# Patient Record
Sex: Female | Born: 1975 | Race: Black or African American | Hispanic: No | Marital: Single | Smoking: Current every day smoker
Health system: Southern US, Community
[De-identification: ages and names within clinical notes are randomized; demographics above are authoritative.]

## PROBLEM LIST (undated history)

## (undated) DIAGNOSIS — I10 Essential (primary) hypertension: Secondary | ICD-10-CM

## (undated) DIAGNOSIS — F4310 Post-traumatic stress disorder, unspecified: Secondary | ICD-10-CM

---

## 1997-09-04 ENCOUNTER — Inpatient Hospital Stay (HOSPITAL_COMMUNITY): Admission: AD | Admit: 1997-09-04 | Discharge: 1997-09-05 | Payer: Self-pay | Admitting: *Deleted

## 1997-11-11 ENCOUNTER — Other Ambulatory Visit: Admission: RE | Admit: 1997-11-11 | Discharge: 1997-11-11 | Payer: Self-pay | Admitting: *Deleted

## 1998-01-27 ENCOUNTER — Ambulatory Visit (HOSPITAL_COMMUNITY): Admission: RE | Admit: 1998-01-27 | Discharge: 1998-01-27 | Payer: Self-pay | Admitting: *Deleted

## 1998-02-28 ENCOUNTER — Emergency Department (HOSPITAL_COMMUNITY): Admission: EM | Admit: 1998-02-28 | Discharge: 1998-02-28 | Payer: Self-pay | Admitting: Internal Medicine

## 1998-06-15 ENCOUNTER — Inpatient Hospital Stay (HOSPITAL_COMMUNITY): Admission: AD | Admit: 1998-06-15 | Discharge: 1998-06-15 | Payer: Self-pay | Admitting: *Deleted

## 1998-12-08 ENCOUNTER — Emergency Department (HOSPITAL_COMMUNITY): Admission: EM | Admit: 1998-12-08 | Discharge: 1998-12-08 | Payer: Self-pay | Admitting: Emergency Medicine

## 1998-12-08 ENCOUNTER — Encounter: Payer: Self-pay | Admitting: Emergency Medicine

## 1999-12-05 ENCOUNTER — Emergency Department (HOSPITAL_COMMUNITY): Admission: EM | Admit: 1999-12-05 | Discharge: 1999-12-05 | Payer: Self-pay | Admitting: Emergency Medicine

## 1999-12-12 ENCOUNTER — Emergency Department (HOSPITAL_COMMUNITY): Admission: EM | Admit: 1999-12-12 | Discharge: 1999-12-12 | Payer: Self-pay | Admitting: Emergency Medicine

## 2000-02-27 ENCOUNTER — Inpatient Hospital Stay (HOSPITAL_COMMUNITY): Admission: EM | Admit: 2000-02-27 | Discharge: 2000-02-27 | Payer: Self-pay | Admitting: Obstetrics & Gynecology

## 2000-06-17 ENCOUNTER — Inpatient Hospital Stay (HOSPITAL_COMMUNITY): Admission: AD | Admit: 2000-06-17 | Discharge: 2000-06-17 | Payer: Self-pay | Admitting: *Deleted

## 2000-10-26 ENCOUNTER — Other Ambulatory Visit: Admission: RE | Admit: 2000-10-26 | Discharge: 2000-10-26 | Payer: Self-pay | Admitting: *Deleted

## 2000-12-11 ENCOUNTER — Emergency Department (HOSPITAL_COMMUNITY): Admission: EM | Admit: 2000-12-11 | Discharge: 2000-12-11 | Payer: Self-pay | Admitting: Emergency Medicine

## 2001-02-05 ENCOUNTER — Encounter: Payer: Self-pay | Admitting: *Deleted

## 2001-02-05 ENCOUNTER — Inpatient Hospital Stay (HOSPITAL_COMMUNITY): Admission: AD | Admit: 2001-02-05 | Discharge: 2001-02-05 | Payer: Self-pay | Admitting: *Deleted

## 2001-02-18 ENCOUNTER — Emergency Department (HOSPITAL_COMMUNITY): Admission: EM | Admit: 2001-02-18 | Discharge: 2001-02-18 | Payer: Self-pay | Admitting: Emergency Medicine

## 2001-03-06 ENCOUNTER — Encounter: Admission: RE | Admit: 2001-03-06 | Discharge: 2001-06-04 | Payer: Self-pay | Admitting: Obstetrics and Gynecology

## 2001-05-29 ENCOUNTER — Inpatient Hospital Stay (HOSPITAL_COMMUNITY): Admission: AD | Admit: 2001-05-29 | Discharge: 2001-05-29 | Payer: Self-pay | Admitting: Obstetrics and Gynecology

## 2001-09-05 ENCOUNTER — Inpatient Hospital Stay (HOSPITAL_COMMUNITY): Admission: AD | Admit: 2001-09-05 | Discharge: 2001-09-05 | Payer: Self-pay | Admitting: Obstetrics and Gynecology

## 2001-09-12 ENCOUNTER — Encounter: Payer: Self-pay | Admitting: General Surgery

## 2001-09-12 ENCOUNTER — Encounter: Payer: Self-pay | Admitting: Emergency Medicine

## 2001-09-12 ENCOUNTER — Inpatient Hospital Stay (HOSPITAL_COMMUNITY): Admission: AD | Admit: 2001-09-12 | Discharge: 2001-09-12 | Payer: Self-pay | Admitting: Obstetrics and Gynecology

## 2001-09-17 ENCOUNTER — Encounter (HOSPITAL_COMMUNITY): Admission: RE | Admit: 2001-09-17 | Discharge: 2001-09-17 | Payer: Self-pay | Admitting: Obstetrics and Gynecology

## 2001-09-22 ENCOUNTER — Inpatient Hospital Stay (HOSPITAL_COMMUNITY): Admission: AD | Admit: 2001-09-22 | Discharge: 2001-09-24 | Payer: Self-pay | Admitting: Obstetrics and Gynecology

## 2001-11-22 ENCOUNTER — Other Ambulatory Visit: Admission: RE | Admit: 2001-11-22 | Discharge: 2001-11-22 | Payer: Self-pay | Admitting: Obstetrics and Gynecology

## 2003-03-17 ENCOUNTER — Encounter: Admission: RE | Admit: 2003-03-17 | Discharge: 2003-06-15 | Payer: Self-pay | Admitting: Obstetrics & Gynecology

## 2003-10-26 ENCOUNTER — Ambulatory Visit (HOSPITAL_COMMUNITY): Admission: RE | Admit: 2003-10-26 | Discharge: 2003-10-26 | Payer: Self-pay | Admitting: Obstetrics & Gynecology

## 2003-11-04 ENCOUNTER — Emergency Department (HOSPITAL_COMMUNITY): Admission: EM | Admit: 2003-11-04 | Discharge: 2003-11-04 | Payer: Self-pay | Admitting: Emergency Medicine

## 2004-06-26 ENCOUNTER — Emergency Department (HOSPITAL_COMMUNITY): Admission: EM | Admit: 2004-06-26 | Discharge: 2004-06-26 | Payer: Self-pay | Admitting: Emergency Medicine

## 2004-12-01 ENCOUNTER — Emergency Department (HOSPITAL_COMMUNITY): Admission: EM | Admit: 2004-12-01 | Discharge: 2004-12-01 | Payer: Self-pay | Admitting: Family Medicine

## 2005-04-17 ENCOUNTER — Inpatient Hospital Stay (HOSPITAL_COMMUNITY): Admission: AD | Admit: 2005-04-17 | Discharge: 2005-04-17 | Payer: Self-pay | Admitting: Obstetrics

## 2005-07-24 DIAGNOSIS — I10 Essential (primary) hypertension: Secondary | ICD-10-CM

## 2005-11-14 ENCOUNTER — Encounter: Admission: RE | Admit: 2005-11-14 | Discharge: 2005-11-14 | Payer: Self-pay | Admitting: Gastroenterology

## 2006-10-24 ENCOUNTER — Inpatient Hospital Stay (HOSPITAL_COMMUNITY): Admission: AD | Admit: 2006-10-24 | Discharge: 2006-10-24 | Payer: Self-pay | Admitting: Obstetrics and Gynecology

## 2006-12-21 ENCOUNTER — Emergency Department (HOSPITAL_COMMUNITY): Admission: EM | Admit: 2006-12-21 | Discharge: 2006-12-21 | Payer: Self-pay | Admitting: Family Medicine

## 2007-02-23 ENCOUNTER — Emergency Department (HOSPITAL_COMMUNITY): Admission: EM | Admit: 2007-02-23 | Discharge: 2007-02-23 | Payer: Self-pay | Admitting: Emergency Medicine

## 2007-05-30 ENCOUNTER — Emergency Department (HOSPITAL_COMMUNITY): Admission: EM | Admit: 2007-05-30 | Discharge: 2007-05-30 | Payer: Self-pay | Admitting: Emergency Medicine

## 2007-06-10 ENCOUNTER — Emergency Department (HOSPITAL_COMMUNITY): Admission: EM | Admit: 2007-06-10 | Discharge: 2007-06-10 | Payer: Self-pay | Admitting: *Deleted

## 2007-08-11 ENCOUNTER — Emergency Department (HOSPITAL_COMMUNITY): Admission: EM | Admit: 2007-08-11 | Discharge: 2007-08-11 | Payer: Self-pay | Admitting: Family Medicine

## 2007-11-21 ENCOUNTER — Emergency Department (HOSPITAL_COMMUNITY): Admission: EM | Admit: 2007-11-21 | Discharge: 2007-11-21 | Payer: Self-pay | Admitting: Family Medicine

## 2008-08-08 ENCOUNTER — Emergency Department (HOSPITAL_COMMUNITY): Admission: EM | Admit: 2008-08-08 | Discharge: 2008-08-08 | Payer: Self-pay | Admitting: Emergency Medicine

## 2008-11-19 ENCOUNTER — Emergency Department (HOSPITAL_COMMUNITY): Admission: EM | Admit: 2008-11-19 | Discharge: 2008-11-20 | Payer: Self-pay | Admitting: Emergency Medicine

## 2008-11-19 LAB — CONVERTED CEMR LAB
BUN: 6 mg/dL
Chloride: 106 meq/L
Creatinine, Ser: 0.9 mg/dL
Hemoglobin: 12.7 g/dL
Platelets: 225 10*3/uL
Potassium: 3.3 meq/L
RDW: 13.6 %
WBC: 9.6 10*3/uL

## 2008-11-23 ENCOUNTER — Encounter (INDEPENDENT_AMBULATORY_CARE_PROVIDER_SITE_OTHER): Payer: Self-pay | Admitting: Nurse Practitioner

## 2008-12-03 ENCOUNTER — Ambulatory Visit: Payer: Self-pay | Admitting: Nurse Practitioner

## 2008-12-03 DIAGNOSIS — R634 Abnormal weight loss: Secondary | ICD-10-CM | POA: Insufficient documentation

## 2008-12-03 LAB — CONVERTED CEMR LAB

## 2008-12-04 ENCOUNTER — Encounter (INDEPENDENT_AMBULATORY_CARE_PROVIDER_SITE_OTHER): Payer: Self-pay | Admitting: Nurse Practitioner

## 2008-12-07 DIAGNOSIS — F4310 Post-traumatic stress disorder, unspecified: Secondary | ICD-10-CM

## 2009-04-14 ENCOUNTER — Emergency Department (HOSPITAL_COMMUNITY): Admission: EM | Admit: 2009-04-14 | Discharge: 2009-04-14 | Payer: Self-pay | Admitting: Family Medicine

## 2009-06-01 ENCOUNTER — Ambulatory Visit (HOSPITAL_COMMUNITY): Admission: RE | Admit: 2009-06-01 | Discharge: 2009-06-01 | Payer: Self-pay | Admitting: Obstetrics

## 2009-07-27 ENCOUNTER — Encounter: Admission: RE | Admit: 2009-07-27 | Discharge: 2009-07-27 | Payer: Self-pay | Admitting: Cardiology

## 2009-07-28 ENCOUNTER — Ambulatory Visit (HOSPITAL_COMMUNITY): Admission: RE | Admit: 2009-07-28 | Discharge: 2009-07-28 | Payer: Self-pay | Admitting: Cardiology

## 2010-07-19 ENCOUNTER — Emergency Department (HOSPITAL_COMMUNITY)
Admission: EM | Admit: 2010-07-19 | Discharge: 2010-07-19 | Payer: Self-pay | Source: Home / Self Care | Admitting: Emergency Medicine

## 2010-08-14 ENCOUNTER — Encounter: Payer: Self-pay | Admitting: Cardiology

## 2010-09-26 ENCOUNTER — Other Ambulatory Visit (HOSPITAL_COMMUNITY): Payer: Self-pay | Admitting: Obstetrics

## 2010-09-26 DIAGNOSIS — Z1231 Encounter for screening mammogram for malignant neoplasm of breast: Secondary | ICD-10-CM

## 2010-09-29 ENCOUNTER — Ambulatory Visit (HOSPITAL_COMMUNITY)
Admission: RE | Admit: 2010-09-29 | Discharge: 2010-09-29 | Disposition: A | Discharge status: Home / Self Care | Payer: Medicaid Other | Source: Ambulatory Visit | Attending: Obstetrics | Admitting: Obstetrics

## 2010-09-29 DIAGNOSIS — Z1231 Encounter for screening mammogram for malignant neoplasm of breast: Secondary | ICD-10-CM | POA: Insufficient documentation

## 2010-10-03 LAB — POCT PREGNANCY, URINE: Preg Test, Ur: NEGATIVE

## 2010-10-03 LAB — POCT I-STAT, CHEM 8
BUN: 4 mg/dL — ABNORMAL LOW (ref 6–23)
Calcium, Ion: 1.09 mmol/L — ABNORMAL LOW (ref 1.12–1.32)
Chloride: 104 mEq/L (ref 96–112)
Glucose, Bld: 86 mg/dL (ref 70–99)

## 2010-10-03 LAB — URINALYSIS, ROUTINE W REFLEX MICROSCOPIC
Bilirubin Urine: NEGATIVE
Ketones, ur: NEGATIVE mg/dL
Leukocytes, UA: NEGATIVE
Nitrite: NEGATIVE
Protein, ur: NEGATIVE mg/dL
Urobilinogen, UA: 0.2 mg/dL (ref 0.0–1.0)
pH: 7.5 (ref 5.0–8.0)

## 2010-10-03 LAB — URINE MICROSCOPIC-ADD ON

## 2010-10-04 ENCOUNTER — Other Ambulatory Visit: Payer: Self-pay | Admitting: Cardiology

## 2010-10-05 ENCOUNTER — Ambulatory Visit
Admission: RE | Admit: 2010-10-05 | Discharge: 2010-10-05 | Disposition: A | Discharge status: Home / Self Care | Payer: Medicaid Other | Source: Ambulatory Visit | Attending: Cardiology | Admitting: Cardiology

## 2010-10-05 ENCOUNTER — Other Ambulatory Visit: Payer: Medicaid Other

## 2010-10-27 ENCOUNTER — Ambulatory Visit (HOSPITAL_BASED_OUTPATIENT_CLINIC_OR_DEPARTMENT_OTHER): Payer: Medicaid Other | Attending: Cardiology

## 2010-10-27 DIAGNOSIS — G4730 Sleep apnea, unspecified: Secondary | ICD-10-CM | POA: Insufficient documentation

## 2010-10-27 DIAGNOSIS — G4710 Hypersomnia, unspecified: Secondary | ICD-10-CM | POA: Insufficient documentation

## 2010-10-27 DIAGNOSIS — Z79899 Other long term (current) drug therapy: Secondary | ICD-10-CM | POA: Insufficient documentation

## 2010-10-28 LAB — POCT URINALYSIS DIP (DEVICE)
Bilirubin Urine: NEGATIVE
Ketones, ur: NEGATIVE mg/dL
Protein, ur: NEGATIVE mg/dL
Specific Gravity, Urine: >=1.03 (ref 1.005–1.030)
pH: 5.5 (ref 5.0–8.0)

## 2010-11-02 LAB — POCT CARDIAC MARKERS
CKMB, poc: <1 ng/mL — ABNORMAL LOW (ref 1.0–8.0)
CKMB, poc: <1 ng/mL — ABNORMAL LOW (ref 1.0–8.0)
Myoglobin, poc: 37 ng/mL (ref 12–200)
Myoglobin, poc: 47.3 ng/mL (ref 12–200)
Troponin i, poc: <0.05 ng/mL (ref 0.00–0.09)

## 2010-11-02 LAB — POCT I-STAT, CHEM 8
BUN: 6 mg/dL (ref 6–23)
Chloride: 106 mEq/L (ref 96–112)
Creatinine, Ser: 0.9 mg/dL (ref 0.4–1.2)
Glucose, Bld: 85 mg/dL (ref 70–99)
Potassium: 3.3 mEq/L — ABNORMAL LOW (ref 3.5–5.1)
Sodium: 139 mEq/L (ref 135–145)

## 2010-11-02 LAB — CBC
MCV: 92.7 fL (ref 78.0–100.0)
RBC: 4.02 MIL/uL (ref 3.87–5.11)
WBC: 9.6 10*3/uL (ref 4.0–10.5)

## 2010-11-02 LAB — DIFFERENTIAL
Eosinophils Absolute: 0.1 10*3/uL (ref 0.0–0.7)
Lymphocytes Relative: 18 % (ref 12–46)
Lymphs Abs: 1.7 10*3/uL (ref 0.7–4.0)
Monocytes Relative: 5 % (ref 3–12)
Neutro Abs: 7.3 10*3/uL (ref 1.7–7.7)
Neutrophils Relative %: 76 % (ref 43–77)

## 2010-11-02 LAB — URINALYSIS, ROUTINE W REFLEX MICROSCOPIC
Bilirubin Urine: NEGATIVE
Ketones, ur: NEGATIVE mg/dL
Protein, ur: NEGATIVE mg/dL
Urobilinogen, UA: 1 mg/dL (ref 0.0–1.0)

## 2010-11-02 LAB — RAPID URINE DRUG SCREEN, HOSP PERFORMED
Benzodiazepines: NOT DETECTED
Tetrahydrocannabinol: POSITIVE — AB

## 2010-11-02 LAB — URINE MICROSCOPIC-ADD ON

## 2010-11-05 DIAGNOSIS — G4710 Hypersomnia, unspecified: Secondary | ICD-10-CM

## 2010-11-05 DIAGNOSIS — G4730 Sleep apnea, unspecified: Secondary | ICD-10-CM

## 2010-11-05 DIAGNOSIS — Z79899 Other long term (current) drug therapy: Secondary | ICD-10-CM

## 2010-11-06 NOTE — Procedures (Signed)
NAMEJACY, Penny Shelton              ACCOUNT NO.:  [id number]  MEDICAL RECORD NO.:  [id number]          PATIENT TYPE:  OUT  LOCATION:  SLEEP CENTER                 FACILITY:  [facility]  PHYSICIAN:  Clinton D. Maple Hudson, MD, FCCP, FACPDATE OF BIRTH:  05/04/75  DATE OF STUDY:  10/27/2010                           NOCTURNAL POLYSOMNOGRAM  REFERRING PHYSICIAN:  JEROME O SPRUILL  INDICATION FOR STUDY:  Hypersomnia with sleep apnea.  EPWORTH SLEEPINESS SCORE:  17/24, BMI 20.  Weight 120 pounds, height 65 inches.  Neck 15.5 inches.  MEDICATIONS:  Home medications are charted and reviewed.  SLEEP ARCHITECTURE:  Total sleep time 320 minutes with sleep efficiency 81%.  Stage I was 8.1%, stage II 71.6%, stage III absent, REM 20.3% of total sleep time.  Sleep latency 27 minutes, REM latency 83.5 minutes, awake after sleep onset 47 minutes, arousal index 24.9.  BEDTIME MEDICATION:  Tribenzor.  RESPIRATORY DATA:  Apnea-hypopnea index (AHI) 0.2 per hour.  A single event was recorded as a central apnea while non supine in REM.  REM/AHI 0.9, RDI 3.6 per hour.  There were insufficient numbers of events to initiate CPAP titration by split protocol on this study night.  OXYGEN DATA:  Moderate snoring with oxygen desaturation to a nadir of 96% and a mean oxygen saturation through the study of 97.9% on room air.  CARDIAC DATA:  Normal sinus rhythm.  MOVEMENT-PARASOMNIA:  No significant movement disturbance.  Bathroom x2.  IMPRESSIONS-RECOMMENDATIONS: 1. Unremarkable sleep architecture for sleep center environment     without sedating medication at bedtime on the study night. 2. Rare respiratory event with sleep disturbance, within normal     limits, AHI 0.2 per hour (normal range 0-5 per     hour).  Moderate snoring with oxygen desaturation to a nadir of 96%     and a mean oxygen saturation through the study of 97.9% on room     air.     Clinton D. Maple Hudson, MD, [facility] FACP Diplomate, Biomedical engineer  of Sleep Medicine Electronically Signed    CDY/MEDQ  D:  11/05/2010 15:04:57  T:  11/06/2010 06:46:44  Job:  [id number]

## 2010-11-07 LAB — POCT URINALYSIS DIP (DEVICE)
Nitrite: NEGATIVE
Protein, ur: 30 mg/dL — AB
pH: 6.5 (ref 5.0–8.0)

## 2010-11-07 LAB — WET PREP, GENITAL

## 2010-12-09 NOTE — Discharge Summary (Signed)
[facility]  Patient:    Penny Shelton, Penny Shelton Visit Number: [id number] MRN: [id number]          Service Type: OBS Location: 910A [id number] [id number] Attending Physician:  Wandalee Ferdinand Dictated by:   Rudy Jew Ashley Royalty, M.D. Admit Date:  09/22/2001 Discharge Date: 09/24/2001                             Discharge Summary  DISCHARGE DIAGNOSES:          1. Intrauterine pregnancy at term, delivered.                               2. Previous cesarean section.                               3. Smoker.                               4. History of drug use during pregnancy                                  including marijuana.                               5. Term born living child.  OPERATIONS/SPECIAL PROCEDURES:                   OB delivery.  CONSULTATIONS:                None.  DISCHARGE MEDICATIONS:        1. Anaprox DS 1 t.i.d. p.r.n.                               2. Phenergan 25 mg p.o. q.3-4h. p.r.n. nausea.  HISTORY AND PHYSICAL:         This is a 35 year old gravida 7, para 1-1-4-2, at 38-1/[redacted] weeks gestation with the aforementioned risk factors.  The patient presented in labor.  HOSPITAL COURSE:              The patient was admitted to [facility] [place].  Admission laboratory studies were drawn.  Initial cervical examination revealed the cervix to be 4 cm dilated, 75% effaced, -1 station, and vertex presentation.  The patient went on to labor and deliver on September 22, 2001.  The infant was a 6 pounds 14 ounce female, Apgars 8 at 1 minute and 9 at five minutes, sent to the newborn nursery.  The delivery was accomplished by Dr. Lonell Face over an intact perineum.  There was no laceration.  The delivery was complicated only by the operator catching the skin of the newborns left elbow in a Kelly clamp.  There was no sequela. Arterial cord pH was 7.39.  The patients postpartum course was benign.  She was discharged on the second postpartum  day, afebrile, and in satisfactory condition.  ACCESSORY CLINICAL FINDINGS:  Hemoglobin and hematocrit on admission were 11.1 and 32.8, respectively.  Repeat values obtained September 23, 2001, were 9.6 and 27.8, respectively.  DISPOSITION:  The patient is to return to [facility] in 4-6 weeks for postpartum examination. Dictated by:   Rudy Jew Ashley Royalty, M.D. Attending Physician:  Wandalee Ferdinand DD:  10/05/01 TD:  10/07/01 Job: [id number]

## 2010-12-09 NOTE — H&P (Signed)
   Penny Shelton, Penny Shelton                          ACCOUNT NO.:  [id number]   MEDICAL RECORD NO.:  [id number]                   PATIENT TYPE:  AMB   LOCATION:  SDC                                  FACILITY:  [facility]   PHYSICIAN:  James A. Ashley Royalty, M.D.             DATE OF BIRTH:  07-03-1976   DATE OF ADMISSION:  07/30/2002  DATE OF DISCHARGE:                                HISTORY & PHYSICAL   HISTORY OF PRESENT ILLNESS:  This is a 35 year old, gravida 7, para 2-1-4-2,  who recently presented complaining of abnormal uterine bleeding.  She has  been given every opportunity to be controlled medically including several  different oral contraceptives and each has been unsuccessful at controlling  her abnormal bleeding.  Ultrasound and sonohysterogram were performed  May 22, 2002, and were negative.  The patient is for  diagnostic/operative hysteroscopy and dilatation and curettage.   MEDICATIONS:  None.   PAST MEDICAL HISTORY:  Medical; negative.  Surgical; cesarean section.   ALLERGIES:  No known drug allergies.   SOCIAL HISTORY:  The patient has used nonprescription recreational drugs in  the past. She uses alcohol moderately. She has used tobacco in the past as  well.   REVIEW OF SYSTEMS:  Noncontributory.   PHYSICAL EXAMINATION:  GENERAL:  A well-developed, well-nourished, pleasant  black female in no acute distress.  VITAL SIGNS: Afebrile, vital signs stable.  SKIN:  Warm and dry without lesions.  LYMPHS: There is no supraclavicular, cervical, or inguinal adenopathy.  HEENT: Normocephalic.  NECK: Supple without thyromegaly.  CHEST:  Lungs clear.  HEART:  Regular rate and rhythm without murmurs, rubs, or gallops.  BREASTS: Deferred.  ABDOMEN: Soft and nontender without masses or organomegaly. Bowel sounds  active.  MUSCULOSKELETAL:  Full range of motion without edema, cyanosis, or CVA  tenderness.  PELVIC:  Deferred until examination under anesthesia.   IMPRESSION:   Abnormal uterine bleeding.   PLAN:  Diagnostic/operative hysteroscopy, dilatation and curettage.  The  risks, benefits, complications, and alternatives were fully discussed with  the patient. She states she understands and accepts. Questions were  evaluated and answered.                                              James A. Ashley Royalty, M.D.   JAM/MEDQ  D:  07/30/2002  T:  07/30/2002  Job:  [id number]

## 2011-05-15 ENCOUNTER — Emergency Department (HOSPITAL_COMMUNITY): Payer: Medicaid Other

## 2011-05-15 ENCOUNTER — Inpatient Hospital Stay (HOSPITAL_COMMUNITY)
Admission: EM | Admit: 2011-05-15 | Discharge: 2011-05-17 | DRG: 758 | Disposition: A | Discharge status: Home / Self Care | Payer: Medicaid Other | Attending: Internal Medicine | Admitting: Internal Medicine

## 2011-05-15 DIAGNOSIS — R51 Headache: Secondary | ICD-10-CM | POA: Diagnosis present

## 2011-05-15 DIAGNOSIS — R112 Nausea with vomiting, unspecified: Secondary | ICD-10-CM | POA: Diagnosis present

## 2011-05-15 DIAGNOSIS — R000 Tachycardia, unspecified: Secondary | ICD-10-CM | POA: Diagnosis present

## 2011-05-15 DIAGNOSIS — H53149 Visual discomfort, unspecified: Secondary | ICD-10-CM | POA: Diagnosis present

## 2011-05-15 DIAGNOSIS — R509 Fever, unspecified: Secondary | ICD-10-CM | POA: Diagnosis present

## 2011-05-15 DIAGNOSIS — Z87891 Personal history of nicotine dependence: Secondary | ICD-10-CM

## 2011-05-15 DIAGNOSIS — Z1388 Encounter for screening for disorder due to exposure to contaminants: Secondary | ICD-10-CM

## 2011-05-15 DIAGNOSIS — N739 Female pelvic inflammatory disease, unspecified: Principal | ICD-10-CM | POA: Diagnosis present

## 2011-05-15 DIAGNOSIS — R109 Unspecified abdominal pain: Secondary | ICD-10-CM | POA: Diagnosis present

## 2011-05-15 DIAGNOSIS — M542 Cervicalgia: Secondary | ICD-10-CM | POA: Diagnosis present

## 2011-05-15 DIAGNOSIS — E876 Hypokalemia: Secondary | ICD-10-CM | POA: Diagnosis present

## 2011-05-15 DIAGNOSIS — I10 Essential (primary) hypertension: Secondary | ICD-10-CM | POA: Diagnosis present

## 2011-05-15 LAB — CBC
Hemoglobin: 11.3 g/dL — ABNORMAL LOW (ref 12.0–15.0)
MCH: 29.8 pg (ref 26.0–34.0)
MCV: 88.9 fL (ref 78.0–100.0)
Platelets: 190 10*3/uL (ref 150–400)
RBC: 3.79 MIL/uL — ABNORMAL LOW (ref 3.87–5.11)
WBC: 7.3 10*3/uL (ref 4.0–10.5)

## 2011-05-15 LAB — URINE MICROSCOPIC-ADD ON

## 2011-05-15 LAB — COMPREHENSIVE METABOLIC PANEL
ALT: 12 U/L (ref 0–35)
BUN: 9 mg/dL (ref 6–23)
CO2: 23 mEq/L (ref 19–32)
Calcium: 8.9 mg/dL (ref 8.4–10.5)
Chloride: 102 mEq/L (ref 96–112)
GFR calc Af Amer: >90 mL/min (ref >90)
Glucose, Bld: 92 mg/dL (ref 70–99)
Potassium: 3 mEq/L — ABNORMAL LOW (ref 3.5–5.1)
Total Protein: 6.9 g/dL (ref 6.0–8.3)

## 2011-05-15 LAB — CSF CELL COUNT WITH DIFFERENTIAL
RBC Count, CSF: 11 /mm3 — ABNORMAL HIGH
RBC Count, CSF: 0 /mm3
Tube #: 4

## 2011-05-15 LAB — DIFFERENTIAL
Basophils Relative: 0 % (ref 0–1)
Eosinophils Absolute: 0 10*3/uL (ref 0.0–0.7)
Lymphs Abs: 0.3 10*3/uL — ABNORMAL LOW (ref 0.7–4.0)
Monocytes Relative: 2 % — ABNORMAL LOW (ref 3–12)
Neutro Abs: 6.8 10*3/uL (ref 1.7–7.7)
Neutrophils Relative %: 94 % — ABNORMAL HIGH (ref 43–77)

## 2011-05-15 LAB — GRAM STAIN: Gram Stain: NONE SEEN

## 2011-05-15 LAB — URINALYSIS, ROUTINE W REFLEX MICROSCOPIC
Leukocytes, UA: NEGATIVE
Protein, ur: NEGATIVE mg/dL
Urobilinogen, UA: 1 mg/dL (ref 0.0–1.0)

## 2011-05-15 LAB — PREGNANCY, URINE: Preg Test, Ur: NEGATIVE

## 2011-05-15 LAB — LACTIC ACID, PLASMA: Lactic Acid, Venous: 0.6 mmol/L (ref 0.5–2.2)

## 2011-05-15 LAB — PROCALCITONIN: Procalcitonin: <0.1 ng/mL

## 2011-05-16 ENCOUNTER — Inpatient Hospital Stay (HOSPITAL_COMMUNITY): Payer: Medicaid Other

## 2011-05-16 LAB — HIV ANTIBODY (ROUTINE TESTING W REFLEX): HIV: NONREACTIVE

## 2011-05-16 LAB — HERPES SIMPLEX VIRUS(HSV) DNA BY PCR: HSV 1 DNA: NOT DETECTED

## 2011-05-16 LAB — BASIC METABOLIC PANEL
Chloride: 107 mEq/L (ref 96–112)
Creatinine, Ser: 0.74 mg/dL (ref 0.50–1.10)
GFR calc Af Amer: >90 mL/min (ref >90)
Potassium: 3.2 mEq/L — ABNORMAL LOW (ref 3.5–5.1)
Sodium: 140 mEq/L (ref 135–145)

## 2011-05-16 LAB — C-REACTIVE PROTEIN: CRP: 2.71 mg/dL — ABNORMAL HIGH (ref <0.60)

## 2011-05-16 LAB — CBC
MCV: 89.1 fL (ref 78.0–100.0)
Platelets: 148 10*3/uL — ABNORMAL LOW (ref 150–400)
RBC: 3.38 MIL/uL — ABNORMAL LOW (ref 3.87–5.11)
RDW: 13.3 % (ref 11.5–15.5)
WBC: 2.4 10*3/uL — ABNORMAL LOW (ref 4.0–10.5)

## 2011-05-16 LAB — SEDIMENTATION RATE: Sed Rate: 1 mm/hr (ref 0–22)

## 2011-05-16 LAB — MRSA PCR SCREENING: MRSA by PCR: NEGATIVE

## 2011-05-16 MED ORDER — IOHEXOL 300 MG/ML  SOLN
100.0000 mL | Freq: Once | INTRAMUSCULAR | Status: AC | PRN
  Administered 2011-05-16: 100 mL via INTRAVENOUS

## 2011-05-16 NOTE — H&P (Signed)
NAMESILVANA, Penny Shelton              ACCOUNT NO.:  [id number]  MEDICAL RECORD NO.:  [id number]  LOCATION:  WLED                         FACILITY:  [facility]  PHYSICIAN:  Gery Pray, MD      DATE OF BIRTH:  02/02/1976  DATE OF ADMISSION:  05/15/2011 DATE OF DISCHARGE:                             HISTORY & PHYSICAL   PRIMARY CARE PHYSICIAN:  None.  CODE STATUS:  Full code.  The patient goes to team 2.  CHIEF COMPLAINT:  Headache, fever, nausea, vomiting.  HISTORY OF PRESENT ILLNESS:  This is a healthy 35 year old female who went to work today when she suddenly developed very high fevers, severe headache, nausea, and vomiting. She checked her blood pressure, it was approximately 180/110. she does have a history of controlled  hypertension. She had severe fevers  and she did not feel right, she was taken to ER.  In the ER, the patient's temperature has been over 103.  They have given her antipyretics which have brought the temperature down; however, it continued re-spike.  She did complain that she had neck pain.  She states her headache is the bilateral temporal region.  She does have pretty marked photophobia.  The patient is quite ill-appearing.  She has had no sick contacts.  She does report some mild blurred vision.  She has no altered mental status.  She has not had any recent epidural injections.  She did have a runny nose, no cough.  She has not traveled. She does have a pet at home, a dog which has been ill with some GI issues over the past 24 hours.  She has no rash.  History obtained mainly from her boyfriend, Penny Shelton who is at the bedside. patient feels too ill to give much history.   REVIEW OF SYSTEMS:  As best able to, all 10 point systems reviewed and negative except as noted in HPI.  PAST MEDICAL HISTORY:  Includes hypertension.  PAST SURGICAL HISTORY:  C-section.  MEDICATIONS:  Bystolic.  ALLERGIES:  NO KNOWN DRUG ALLERGIES.  SOCIAL HISTORY:  The patient  stopped smoking 2 weeks and 3 days ago. Negative for alcohol or illicit drugs.  The patient lives alone with their child.  FAMILY HISTORY:  Significant for hypertension.  PHYSICAL EXAMINATION:  VITAL SIGNS:  Blood pressure initial 171/102, currently 139/87; pulse, initially 130, currently 114; respirations 20, temperature 99.7, and T-max today 103. GENERAL:  Quite ill-appearing female with chills. EYES:  PERRLA. ENT:  Somewhat dry mucosa, trachea midline. NECK:  Supple.  No thyromegaly.  The patient does have a positive Brudzinski's sign. LUNGS:  Clear to auscultation.  No wheeze.  No use of accessory muscles. CARDIOVASCULAR:  Regular rate and rhythm without murmurs, rigors, or gallops.  No JVD. ABDOMEN:  Soft, positive bowel sounds, nontender, and nondistended.  No organomegaly. NEURO:  Cranial nerves II through XII appears grossly intact.  Sensation intact. MUSCULOSKELETAL:  Strength 5/5 in all extremities.  No clubbing, cyanosis, or edema. SKIN:  No rashes.  No subcutaneous crepitations.  LABORATORY DATA:  UA is negative with no leukocyte esterase, no nitrates.  Prolactin level is less than 0.1.  Pregnancy test is negative.  Sodium 136, potassium  3.0, chloride 102, CO2 of 23, glucose 72, BUN 9, creatinine 0.74.  LFTs were normal.  Lactic acid 0.6.  White blood count is normal at 7.3, hemoglobin 11.3, platelets 190.  CT head shows negative for any acute issues.  Chest x-ray shows normal chest radiographs.  The patient did have a LP in the ER, which showed 0 white blood cells.  It was colorless.  Glucose was 58, protein of 17. Neutrophils were too few to count.  ASSESSMENT AND PLAN: 1. Fever of an unknown origin. 2. Severe headache/photophobia. 3. Nausea and vomiting.  The patient will be admitted to step-down.     Blood cultures have been collected.  The patient's symptoms were     very concerning for meningitis.  Despite the negative initial     report of the LP, I will be  sure to make sure that there is a Gram     stain and await cultures.  The Gram stain and     cultures are ordered.  We will start the patient on antibiotics vancomycin and     Rocephin.  We will order p.r.n. Tylenol as well     and antiemetics.  We will check an ESR and C-reactive     protein.  IV Protonix will be ordered.  The patient will be on a     clear liquid diet and IV fluids for hydration.  We will monitor the     patient in the step-down.  Seizure precautions will be ordered.  We     will also add a urine drug screen. 4. Hypokalemia.  We will replete it with IV fluids. 5. Hypertension, poor control.  We will order some p.r.n. blood     pressure medications and resume patient's home medications. 6. Tachycardia, likely related to the patient's fevers.          ______________________________ Gery Pray, MD     DC/MEDQ  D:  05/15/2011  T:  05/15/2011  Job:  [id number]  Electronically Signed by Gery Pray MD on 05/16/2011 04:00:02 AM

## 2011-05-17 LAB — RAPID URINE DRUG SCREEN, HOSP PERFORMED
Amphetamines: NOT DETECTED
Barbiturates: NOT DETECTED
Benzodiazepines: NOT DETECTED

## 2011-05-17 NOTE — Discharge Summary (Signed)
  NAMEYARDLEY, BELTRAN              ACCOUNT NO.:  [id number]  MEDICAL RECORD NO.:  [id number]  LOCATION:  1234                         FACILITY:  [facility]  PHYSICIAN:  Conley Canal, MD      DATE OF BIRTH:  Nov 23, 1975  DATE OF ADMISSION:  05/15/2011 DATE OF DISCHARGE:  05/17/2011                        DISCHARGE SUMMARY - REFERRING   PRIMARY CARE PHYSICIAN:  None.  DISCHARGE DIAGNOSES: 1. Severe pelvic inflammatory disease. 2. Accelerated malignant hypertension.  DISCHARGE MEDICATIONS: 1. Tramadol 50 mg every 6 hours as needed, 20 tablets given. 2. Amlodipine 10 mg daily. 3. Doxycycline 100 mg twice daily for 14 days. 4. Hydrochlorothiazide 25 mg daily. 5. Prenatal vitamin 1 tab daily. 6. Xanax 0.25 mg twice daily.  No new prescription given.  PROCEDURES PERFORMED: 1. CT of the brain without contrast on October 22 was negative. 2. Chest x-ray on October 22 is normal. 3. CT of abdomen and pelvis with contrast on October 23 showed     moderate free fluid within the anatomic pelvis and heterogeneous     appearance of the uterus with fluid in the endometrial canal, which     could be related to menstrual phases of the infection.  There are     also some prominent follicles in the ovaries bilaterally with     question of dilated urethra.  There was also some periportal edema     and fluid surrounding the gallbladder with no discrete source of     infection. 4. Lumbar puncture on October 22 was normal.  HOSPITAL COURSE:  Ms. Penny Shelton is a pleasant 35 year old female with history of malignant hypertension, who ran out of medications.  She came in with complaints of chills, headache and left-sided pelvic pain as well as elevated blood pressure.  Upon admission, the patient was found to be tachycardiac and to have temperature of 103 degrees Fahrenheit. There was concern for meningitis, hence lumbar puncture which was unrevealing.  She also had CT abdomen and pelvis, which showed fluid  in the anatomic pelvis and an irregular uterus.  Upon further inquiry, the patient mentions that she had an induced abortion last month.  She probably had severe pelvic inflammatory disease prompting this symptomatology.  The patient was initially placed on vancomycin and ceftriaxone.  The plan is to discharge her on 14 days of doxycycline and to follow with Gynecology for further assessment gynecology-wise.  She is eager to go home.  Today, feels better.  The fever has subsided.  LABS:  Include a negative HIV test, negative HSV test.  CBC showing a WBC of 2.4, hemoglobin 10.3, hematocrit 30.1, platelet count 148.  Of note, is that the CBC was unremarkable at admission except for neutrophils of 94%.  Her pregnancy test was negative.  She is discharged in stable condition.  The time spent for this discharge preparation is less than 30 minutes.     Conley Canal, MD     SR/MEDQ  D:  05/17/2011  T:  05/17/2011  Job:  [id number]  Electronically Signed by Conley Canal  on 05/17/2011 07:45:04 PM

## 2011-05-19 ENCOUNTER — Emergency Department (HOSPITAL_COMMUNITY)
Admission: EM | Admit: 2011-05-19 | Discharge: 2011-05-19 | Disposition: A | Discharge status: Home / Self Care | Payer: Medicaid Other | Attending: Emergency Medicine | Admitting: Emergency Medicine

## 2011-05-19 DIAGNOSIS — I10 Essential (primary) hypertension: Secondary | ICD-10-CM | POA: Insufficient documentation

## 2011-05-19 DIAGNOSIS — G43909 Migraine, unspecified, not intractable, without status migrainosus: Secondary | ICD-10-CM | POA: Insufficient documentation

## 2011-05-19 DIAGNOSIS — N739 Female pelvic inflammatory disease, unspecified: Secondary | ICD-10-CM | POA: Insufficient documentation

## 2011-05-19 LAB — POCT I-STAT, CHEM 8
Calcium, Ion: 1.13 mmol/L (ref 1.12–1.32)
Creatinine, Ser: 0.7 mg/dL (ref 0.50–1.10)
Glucose, Bld: 93 mg/dL (ref 70–99)
HCT: 43 % (ref 36.0–46.0)
Sodium: 138 mEq/L (ref 135–145)

## 2011-05-19 LAB — CSF CULTURE W GRAM STAIN: Gram Stain: NONE SEEN

## 2011-05-21 LAB — CULTURE, BLOOD (ROUTINE X 2)

## 2011-11-17 ENCOUNTER — Emergency Department (HOSPITAL_COMMUNITY): Payer: Medicaid Other

## 2011-11-17 ENCOUNTER — Emergency Department (HOSPITAL_COMMUNITY)
Admission: EM | Admit: 2011-11-17 | Discharge: 2011-11-17 | Disposition: A | Discharge status: Home / Self Care | Payer: Medicaid Other | Attending: Emergency Medicine | Admitting: Emergency Medicine

## 2011-11-17 ENCOUNTER — Encounter (HOSPITAL_COMMUNITY): Payer: Self-pay | Admitting: Emergency Medicine

## 2011-11-17 DIAGNOSIS — R1084 Generalized abdominal pain: Secondary | ICD-10-CM | POA: Insufficient documentation

## 2011-11-17 DIAGNOSIS — R55 Syncope and collapse: Secondary | ICD-10-CM | POA: Insufficient documentation

## 2011-11-17 DIAGNOSIS — I10 Essential (primary) hypertension: Secondary | ICD-10-CM | POA: Insufficient documentation

## 2011-11-17 DIAGNOSIS — R197 Diarrhea, unspecified: Secondary | ICD-10-CM | POA: Insufficient documentation

## 2011-11-17 DIAGNOSIS — R42 Dizziness and giddiness: Secondary | ICD-10-CM

## 2011-11-17 DIAGNOSIS — R112 Nausea with vomiting, unspecified: Secondary | ICD-10-CM | POA: Insufficient documentation

## 2011-11-17 DIAGNOSIS — F4310 Post-traumatic stress disorder, unspecified: Secondary | ICD-10-CM | POA: Insufficient documentation

## 2011-11-17 DIAGNOSIS — R110 Nausea: Secondary | ICD-10-CM

## 2011-11-17 HISTORY — DX: Essential (primary) hypertension: I10

## 2011-11-17 HISTORY — DX: Post-traumatic stress disorder, unspecified: F43.10

## 2011-11-17 LAB — CBC
HCT: 37.8 % (ref 36.0–46.0)
Hemoglobin: 13.1 g/dL (ref 12.0–15.0)
MCH: 30.5 pg (ref 26.0–34.0)
MCHC: 34.7 g/dL (ref 30.0–36.0)
MCV: 87.9 fL (ref 78.0–100.0)
RDW: 13.1 % (ref 11.5–15.5)

## 2011-11-17 LAB — URINALYSIS, ROUTINE W REFLEX MICROSCOPIC
Bilirubin Urine: NEGATIVE
Ketones, ur: NEGATIVE mg/dL
Leukocytes, UA: NEGATIVE
Nitrite: NEGATIVE
Protein, ur: NEGATIVE mg/dL
pH: 6.5 (ref 5.0–8.0)

## 2011-11-17 LAB — DIFFERENTIAL
Basophils Absolute: 0 10*3/uL (ref 0.0–0.1)
Basophils Relative: 0 % (ref 0–1)
Eosinophils Absolute: 0 10*3/uL (ref 0.0–0.7)
Eosinophils Relative: 0 % (ref 0–5)
Monocytes Absolute: 0.4 10*3/uL (ref 0.1–1.0)
Monocytes Relative: 9 % (ref 3–12)

## 2011-11-17 LAB — BASIC METABOLIC PANEL
BUN: 13 mg/dL (ref 6–23)
Calcium: 9.6 mg/dL (ref 8.4–10.5)
Chloride: 101 mEq/L (ref 96–112)
Creatinine, Ser: 0.79 mg/dL (ref 0.50–1.10)
GFR calc Af Amer: >90 mL/min (ref >90)

## 2011-11-17 MED ORDER — ONDANSETRON HCL 4 MG PO TABS: 4.0000 mg | ORAL_TABLET | Freq: Four times a day (QID) | ORAL | Status: AC

## 2011-11-17 MED ORDER — POTASSIUM CHLORIDE CRYS ER 20 MEQ PO TBCR
40.0000 meq | EXTENDED_RELEASE_TABLET | Freq: Once | ORAL | Status: AC
  Administered 2011-11-17: 40 meq via ORAL
  Filled 2011-11-17: qty 2

## 2011-11-17 MED ORDER — MECLIZINE HCL 50 MG PO TABS: 50.0000 mg | ORAL_TABLET | Freq: Three times a day (TID) | ORAL | Status: AC | PRN

## 2011-11-17 MED ORDER — ONDANSETRON HCL 4 MG/2ML IJ SOLN
INTRAMUSCULAR | Status: AC
  Filled 2011-11-17: qty 2

## 2011-11-17 MED ORDER — MECLIZINE HCL 25 MG PO TABS
25.0000 mg | ORAL_TABLET | Freq: Once | ORAL | Status: AC
  Administered 2011-11-17: 25 mg via ORAL
  Filled 2011-11-17: qty 1

## 2011-11-17 MED ORDER — SODIUM CHLORIDE 0.9 % IV BOLUS (SEPSIS)
1000.0000 mL | Freq: Once | INTRAVENOUS | Status: AC
  Administered 2011-11-17: 1000 mL via INTRAVENOUS

## 2011-11-17 MED ORDER — ONDANSETRON HCL 4 MG/2ML IJ SOLN
4.0000 mg | Freq: Once | INTRAMUSCULAR | Status: AC
  Administered 2011-11-17: 4 mg via INTRAVENOUS
  Filled 2011-11-17: qty 2

## 2011-11-17 MED ORDER — MORPHINE SULFATE 4 MG/ML IJ SOLN
4.0000 mg | Freq: Once | INTRAMUSCULAR | Status: AC
  Administered 2011-11-17: 4 mg via INTRAVENOUS
  Filled 2011-11-17: qty 1

## 2011-11-17 NOTE — ED Provider Notes (Signed)
History     CSN: [id number]  Arrival date & time 11/17/11  1410   First MD Initiated Contact with Patient 11/17/11 1415      Chief Complaint  Patient presents with  . Near Syncope    (Consider location/radiation/quality/duration/timing/severity/associated sxs/prior treatment) Patient is a 36 y.o. female presenting with abdominal pain. The history is provided by the patient. No language interpreter was used.  Abdominal Pain The primary symptoms of the illness include abdominal pain, nausea, vomiting and diarrhea. The primary symptoms of the illness do not include fever, fatigue, shortness of breath, hematochezia or dysuria. The current episode started 2 days ago. The onset of the illness was gradual. The problem has been gradually worsening.  The abdominal pain began 2 days ago. The pain came on gradually. The abdominal pain has been gradually worsening since its onset. The abdominal pain is generalized. The abdominal pain does not radiate. The abdominal pain is relieved by nothing. The abdominal pain is exacerbated by movement and eating.  Nausea began 2 days ago. The nausea is associated with eating. The nausea is exacerbated by food.  Symptoms associated with the illness do not include chills, constipation, urgency, frequency or back pain.    Past Medical History  Diagnosis Date  . Hypertension   . PTSD (post-traumatic stress disorder)     Past Surgical History  Procedure Date  . Cesarean section     History reviewed. No pertinent family history.  History  Substance Use Topics  . Smoking status: Former Games developer  . Smokeless tobacco: Not on file  . Alcohol Use: No    OB History    Grav Para Term Preterm Abortions TAB SAB Ect Mult Living                  Review of Systems  Constitutional: Negative for fever, chills, activity change, appetite change and fatigue.  HENT: Negative for congestion, sore throat, rhinorrhea, neck pain and neck stiffness.   Respiratory:  Negative for cough and shortness of breath.   Cardiovascular: Negative for chest pain and palpitations.  Gastrointestinal: Positive for nausea, vomiting, abdominal pain and diarrhea. Negative for constipation and hematochezia.  Genitourinary: Negative for dysuria, urgency, frequency and flank pain.  Musculoskeletal: Negative for myalgias, back pain and arthralgias.  Neurological: Positive for dizziness and light-headedness. Negative for weakness, numbness and headaches.  All other systems reviewed and are negative.    Allergies  Review of patient's allergies indicates no known allergies.  Home Medications   Current Outpatient Rx  Name Route Sig Dispense Refill  . ACETAMINOPHEN 500 MG PO TABS Oral Take 500 mg by mouth every 6 (six) hours as needed. For pain.    Marland Kitchen AMLODIPINE BESYLATE 10 MG PO TABS Oral Take 10 mg by mouth daily.    Marland Kitchen HYDROCHLOROTHIAZIDE 25 MG PO TABS Oral Take 25 mg by mouth daily.    Marland Kitchen PRENATAL MULTIVITAMIN CH Oral Take 1 tablet by mouth daily.    . MECLIZINE HCL 50 MG PO TABS Oral Take 1 tablet (50 mg total) by mouth 3 (three) times daily as needed. 30 tablet 0  . ONDANSETRON HCL 4 MG PO TABS Oral Take 1 tablet (4 mg total) by mouth every 6 (six) hours. 12 tablet 0    BP 146/102  Pulse 77  Temp 98.7 F (37.1 C)  Resp 18  Ht 5\' 5"  (1.651 m)  Wt 133 lb (60.328 kg)  BMI 22.13 kg/m2  SpO2 99%  LMP 11/15/2011  Physical Exam  Nursing note and vitals reviewed. Constitutional: She is oriented to person, place, and time. She appears well-developed and well-nourished. No distress.  HENT:  Head: Normocephalic and atraumatic.  Mouth/Throat: Oropharynx is clear and moist. No oropharyngeal exudate.  Eyes: Conjunctivae and EOM are normal. Pupils are equal, round, and reactive to light.  Neck: Normal range of motion.  Cardiovascular: Normal rate, regular rhythm, normal heart sounds and intact distal pulses.  Exam reveals no gallop and no friction rub.   No murmur  heard. Pulmonary/Chest: Effort normal and breath sounds normal. No respiratory distress. She exhibits no tenderness.  Abdominal: Soft. Bowel sounds are normal. There is no tenderness. There is no rebound and no guarding.  Musculoskeletal: Normal range of motion. She exhibits no edema and no tenderness.  Lymphadenopathy:    She has no cervical adenopathy.  Neurological: She is alert and oriented to person, place, and time.  Skin: Skin is warm and dry. No rash noted.    ED Course  Procedures (including critical care time)   Date: 11/17/2011  Rate: 84  Rhythm: normal sinus rhythm  QRS Axis: normal  Intervals: borderline QT prolongation  ST/T Wave abnormalities: normal  Conduction Disutrbances:none  Narrative Interpretation:   Old EKG Reviewed: unchanged  Labs Reviewed  BASIC METABOLIC PANEL - Abnormal; Notable for the following:    Potassium 3.3 (*)    All other components within normal limits  URINALYSIS, ROUTINE W REFLEX MICROSCOPIC - Abnormal; Notable for the following:    APPearance CLOUDY (*)    Hgb urine dipstick TRACE (*)    All other components within normal limits  URINE MICROSCOPIC-ADD ON - Abnormal; Notable for the following:    Squamous Epithelial / LPF FEW (*)    All other components within normal limits  CBC  DIFFERENTIAL  PREGNANCY, URINE   Dg Chest 2 View  11/17/2011  *RADIOLOGY REPORT*  Clinical Data: 36 year old female with chest pain, syncope. Hypertension.  CHEST - 2 VIEW  Comparison: 05/15/2011 and earlier.  Findings: Upright AP and lateral views of the chest.  Cardiac size and mediastinal contours are within normal limits.  Visualized tracheal air column is within normal limits.  Lung volumes are stable and within normal limits.  The lungs are clear.  No pneumothorax or effusion. No acute osseous abnormality identified.  IMPRESSION: Negative, no acute cardiopulmonary abnormality.  Original Report Authenticated By: Harley Hallmark, M.D.     1. Vertigo   2.  Nausea       MDM  Vertigo with associated nausea. Mild diffuse abdominal pain secondary to her vomiting. She's given meclizine with improvement of her symptoms. It was tolerated oral intake. 2 L of IV fluids with improvement of her symptoms. She'll be discharged home with antiemetics and meclizine. Instructed to followup with her primary care physician. She's been noncompliant with her antihypertensives. I instructed her to take these as directed.  She has plenty left at home        Dayton Bailiff, MD 11/17/11 1558

## 2011-11-17 NOTE — ED Notes (Signed)
Pt c/o dizziness left work drove home and stated she passed out dont remember what happen c/o abd pain bp elevated took bp med yesterday

## 2011-11-17 NOTE — ED Notes (Signed)
[id number]<GN> Expected date:<BR> Expected time: 2:05 PM<BR> Means of arrival:<BR> Comments:<BR> M40 - 35yoF Near syncope, dizzy

## 2011-11-17 NOTE — Discharge Instructions (Signed)

## 2013-12-26 ENCOUNTER — Ambulatory Visit: Payer: Self-pay | Admitting: Internal Medicine

## 2014-12-30 ENCOUNTER — Encounter: Payer: Self-pay | Admitting: *Deleted

## 2014-12-30 ENCOUNTER — Emergency Department: Payer: Medicaid Other

## 2014-12-30 DIAGNOSIS — S0012XA Contusion of left eyelid and periocular area, initial encounter: Secondary | ICD-10-CM | POA: Insufficient documentation

## 2014-12-30 DIAGNOSIS — S62612A Displaced fracture of proximal phalanx of right middle finger, initial encounter for closed fracture: Secondary | ICD-10-CM | POA: Insufficient documentation

## 2014-12-30 DIAGNOSIS — S6991XA Unspecified injury of right wrist, hand and finger(s), initial encounter: Secondary | ICD-10-CM | POA: Diagnosis present

## 2014-12-30 DIAGNOSIS — S40011A Contusion of right shoulder, initial encounter: Secondary | ICD-10-CM | POA: Diagnosis not present

## 2014-12-30 DIAGNOSIS — Z720 Tobacco use: Secondary | ICD-10-CM | POA: Insufficient documentation

## 2014-12-30 DIAGNOSIS — Y998 Other external cause status: Secondary | ICD-10-CM | POA: Diagnosis not present

## 2014-12-30 DIAGNOSIS — Y9389 Activity, other specified: Secondary | ICD-10-CM | POA: Diagnosis not present

## 2014-12-30 DIAGNOSIS — I10 Essential (primary) hypertension: Secondary | ICD-10-CM | POA: Diagnosis not present

## 2014-12-30 DIAGNOSIS — Y9289 Other specified places as the place of occurrence of the external cause: Secondary | ICD-10-CM | POA: Diagnosis not present

## 2014-12-30 DIAGNOSIS — Z79899 Other long term (current) drug therapy: Secondary | ICD-10-CM | POA: Diagnosis not present
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
## 2014-12-31 ENCOUNTER — Emergency Department
Admission: EM | Admit: 2014-12-31 | Discharge: 2014-12-31 | Disposition: A | Discharge status: Home / Self Care | Payer: Medicaid Other | Attending: Emergency Medicine | Admitting: Emergency Medicine

## 2014-12-31 ENCOUNTER — Emergency Department: Payer: Medicaid Other

## 2014-12-31 DIAGNOSIS — S40011A Contusion of right shoulder, initial encounter: Secondary | ICD-10-CM

## 2014-12-31 DIAGNOSIS — S0012XA Contusion of left eyelid and periocular area, initial encounter: Secondary | ICD-10-CM

## 2014-12-31 DIAGNOSIS — S62609A Fracture of unspecified phalanx of unspecified finger, initial encounter for closed fracture: Secondary | ICD-10-CM

## 2014-12-31 MED ORDER — OXYCODONE-ACETAMINOPHEN 5-325 MG PO TABS
1.0000 | ORAL_TABLET | Freq: Once | ORAL | Status: AC
  Administered 2014-12-31: 1 via ORAL

## 2014-12-31 MED ORDER — OXYCODONE-ACETAMINOPHEN 5-325 MG PO TABS
ORAL_TABLET | ORAL | Status: AC
  Administered 2014-12-31: 1 via ORAL
  Filled 2014-12-31: qty 1

## 2014-12-31 NOTE — ED Notes (Signed)
Patient transported to X-ray 

## 2014-12-31 NOTE — ED Notes (Signed)
Pt released to police custody
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
## 2014-12-31 NOTE — Discharge Instructions (Signed)
Contusion A contusion is a deep bruise. Contusions are the result of an injury that caused bleeding under the skin. The contusion may turn blue, purple, or yellow. Minor injuries will give you a painless contusion, but more severe contusions may stay painful and swollen for a few weeks.  CAUSES  A contusion is usually caused by a blow, trauma, or direct force to an area of the body. SYMPTOMS   Swelling and redness of the injured area.  Bruising of the injured area.  Tenderness and soreness of the injured area.  Pain. DIAGNOSIS  The diagnosis can be made by taking a history and physical exam. An X-ray, CT scan, or MRI may be needed to determine if there were any associated injuries, such as fractures. TREATMENT  Specific treatment will depend on what area of the body was injured. In general, the best treatment for a contusion is resting, icing, elevating, and applying cold compresses to the injured area. Over-the-counter medicines may also be recommended for pain control. Ask your caregiver what the best treatment is for your contusion. HOME CARE INSTRUCTIONS   Put ice on the injured area.  Put ice in a plastic bag.  Place a towel between your skin and the bag.  Leave the ice on for 15-20 minutes, 3-4 times a day, or as directed by your health care provider.  Only take over-the-counter or prescription medicines for pain, discomfort, or fever as directed by your caregiver. Your caregiver may recommend avoiding anti-inflammatory medicines (aspirin, ibuprofen, and naproxen) for 48 hours because these medicines may increase bruising.  Rest the injured area.  If possible, elevate the injured area to reduce swelling. SEEK IMMEDIATE MEDICAL CARE IF:   You have increased bruising or swelling.  You have pain that is getting worse.  Your swelling or pain is not relieved with medicines. MAKE SURE YOU:   Understand these instructions.  Will watch your condition.  Will get help right  away if you are not doing well or get worse. Document Released: 04/19/2005 Document Revised: 07/15/2013 Document Reviewed: 05/15/2011 [facility] Patient Information 2015 [place], [place]. This information is not intended to replace advice given to you by your health care provider. Make sure you discuss any questions you have with your health care provider.  Finger Fracture Fractures of fingers are breaks in the bones of the fingers. There are many types of fractures. There are different ways of treating these fractures. Your health care provider will discuss the best way to treat your fracture. CAUSES Traumatic injury is the main cause of broken fingers. These include:  Injuries while playing sports.  Workplace injuries.  Falls. RISK FACTORS Activities that can increase your risk of finger fractures include:  Sports.  Workplace activities that involve machinery.  A condition called osteoporosis, which can make your bones less dense and cause them to fracture more easily. SIGNS AND SYMPTOMS The main symptoms of a broken finger are pain and swelling within 15 minutes after the injury. Other symptoms include:  Bruising of your finger.  Stiffness of your finger.  Numbness of your finger.  Exposed bones (compound fracture) if the fracture is severe. DIAGNOSIS  The best way to diagnose a broken bone is with X-ray imaging. Additionally, your health care provider will use this X-ray image to evaluate the position of the broken finger bones.  TREATMENT  Finger fractures can be treated with:   Nonreduction--This means the bones are in place. The finger is splinted without changing the positions of the bone  pieces. The splint is usually left on for about a week to 10 days. This will depend on your fracture and what your health care provider thinks.  Closed reduction--The bones are put back into position without using surgery. The finger is then splinted.  Open reduction and internal  fixation--The fracture site is opened. Then the bone pieces are fixed into place with pins or some type of hardware. This is seldom required. It depends on the severity of the fracture. HOME CARE INSTRUCTIONS   Follow your health care provider's instructions regarding activities, exercises, and physical therapy.  Only take over-the-counter or prescription medicines for pain, discomfort, or fever as directed by your health care provider. SEEK MEDICAL CARE IF: You have pain or swelling that limits the motion or use of your fingers. SEEK IMMEDIATE MEDICAL CARE IF:  Your finger becomes numb. MAKE SURE YOU:   Understand these instructions.  Will watch your condition.  Will get help right away if you are not doing well or get worse. Document Released: 10/22/2000 Document Revised: 04/30/2013 Document Reviewed: 02/19/2013 [facility] Patient Information 2015 [place], [place]. This information is not intended to replace advice given to you by your health care provider. Make sure you discuss any questions you have with your health care provider.  Facial or Scalp Contusion A facial or scalp contusion is a deep bruise on the face or head. Injuries to the face and head generally cause a lot of swelling, especially around the eyes. Contusions are the result of an injury that caused bleeding under the skin. The contusion may turn blue, purple, or yellow. Minor injuries will give you a painless contusion, but more severe contusions may stay painful and swollen for a few weeks.  CAUSES  A facial or scalp contusion is caused by a blunt injury or trauma to the face or head area.  SIGNS AND SYMPTOMS   Swelling of the injured area.   Discoloration of the injured area.   Tenderness, soreness, or pain in the injured area.  DIAGNOSIS  The diagnosis can be made by taking a medical history and doing a physical exam. An X-ray exam, CT scan, or MRI may be needed to determine if there are any associated injuries,  such as broken bones (fractures). TREATMENT  Often, the best treatment for a facial or scalp contusion is applying cold compresses to the injured area. Over-the-counter medicines may also be recommended for pain control.  HOME CARE INSTRUCTIONS   Only take over-the-counter or prescription medicines as directed by your health care provider.   Apply ice to the injured area.   Put ice in a plastic bag.   Place a towel between your skin and the bag.   Leave the ice on for 20 minutes, 2-3 times a day.  SEEK MEDICAL CARE IF:  You have bite problems.   You have pain with chewing.   You are concerned about facial defects. SEEK IMMEDIATE MEDICAL CARE IF:  You have severe pain or a headache that is not relieved by medicine.   You have unusual sleepiness, confusion, or personality changes.   You throw up (vomit).   You have a persistent nosebleed.   You have double vision or blurred vision.   You have fluid drainage from your nose or ear.   You have difficulty walking or using your arms or legs.  MAKE SURE YOU:   Understand these instructions.  Will watch your condition.  Will get help right away if you are not doing well  or get worse. Document Released: 08/17/2004 Document Revised: 04/30/2013 Document Reviewed: 02/20/2013 [facility] Patient Information 2015 [place], [place]. This information is not intended to replace advice given to you by your health care provider. Make sure you discuss any questions you have with your health care provider.
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
## 2015-10-16 ENCOUNTER — Encounter: Payer: Self-pay | Admitting: Emergency Medicine

## 2015-10-16 ENCOUNTER — Emergency Department
Admission: EM | Admit: 2015-10-16 | Discharge: 2015-10-16 | Disposition: A | Discharge status: Home / Self Care | Payer: BLUE CROSS/BLUE SHIELD | Attending: Emergency Medicine | Admitting: Emergency Medicine

## 2015-10-16 DIAGNOSIS — R197 Diarrhea, unspecified: Secondary | ICD-10-CM | POA: Diagnosis present

## 2015-10-16 DIAGNOSIS — R112 Nausea with vomiting, unspecified: Secondary | ICD-10-CM | POA: Insufficient documentation

## 2015-10-16 DIAGNOSIS — I10 Essential (primary) hypertension: Secondary | ICD-10-CM | POA: Diagnosis not present

## 2015-10-16 DIAGNOSIS — Z79899 Other long term (current) drug therapy: Secondary | ICD-10-CM | POA: Diagnosis not present

## 2015-10-16 LAB — URINALYSIS COMPLETE WITH MICROSCOPIC (ARMC ONLY)
BACTERIA UA: NONE SEEN
Bilirubin Urine: NEGATIVE
GLUCOSE, UA: NEGATIVE mg/dL
Leukocytes, UA: NEGATIVE
NITRITE: NEGATIVE
Protein, ur: NEGATIVE mg/dL
SPECIFIC GRAVITY, URINE: 1.011 (ref 1.005–1.030)
pH: 8 (ref 5.0–8.0)

## 2015-10-16 LAB — COMPREHENSIVE METABOLIC PANEL
ALBUMIN: 4.4 g/dL (ref 3.5–5.0)
ALK PHOS: 53 U/L (ref 38–126)
ALT: 17 U/L (ref 14–54)
AST: 28 U/L (ref 15–41)
Anion gap: 9 (ref 5–15)
BILIRUBIN TOTAL: 0.7 mg/dL (ref 0.3–1.2)
BUN: 10 mg/dL (ref 6–20)
CALCIUM: 9 mg/dL (ref 8.9–10.3)
CO2: 23 mmol/L (ref 22–32)
Chloride: 105 mmol/L (ref 101–111)
Creatinine, Ser: 0.74 mg/dL (ref 0.44–1.00)
GFR calc Af Amer: >60 mL/min (ref >60)
GFR calc non Af Amer: >60 mL/min (ref >60)
GLUCOSE: 112 mg/dL — AB (ref 65–99)
Potassium: 3.3 mmol/L — ABNORMAL LOW (ref 3.5–5.1)
Sodium: 137 mmol/L (ref 135–145)
TOTAL PROTEIN: 8.4 g/dL — AB (ref 6.5–8.1)

## 2015-10-16 LAB — CBC
HCT: 42.1 % (ref 35.0–47.0)
Hemoglobin: 14.2 g/dL (ref 12.0–16.0)
MCH: 29.7 pg (ref 26.0–34.0)
MCHC: 33.8 g/dL (ref 32.0–36.0)
MCV: 88.1 fL (ref 80.0–100.0)
Platelets: 192 10*3/uL (ref 150–440)
RBC: 4.77 MIL/uL (ref 3.80–5.20)
RDW: 13.8 % (ref 11.5–14.5)
WBC: 4.1 10*3/uL (ref 3.6–11.0)

## 2015-10-16 LAB — LIPASE, BLOOD: Lipase: 17 U/L (ref 11–51)

## 2015-10-16 LAB — POCT PREGNANCY, URINE: PREG TEST UR: NEGATIVE

## 2015-10-16 MED ORDER — PROMETHAZINE HCL 25 MG PO TABS: 25.0000 mg | ORAL_TABLET | Freq: Four times a day (QID) | ORAL | Status: DC | PRN

## 2015-10-16 MED ORDER — SODIUM CHLORIDE 0.9 % IV BOLUS (SEPSIS)
1000.0000 mL | Freq: Once | INTRAVENOUS | Status: AC
  Administered 2015-10-16: 1000 mL via INTRAVENOUS

## 2015-10-16 MED ORDER — LORAZEPAM 2 MG/ML IJ SOLN
1.0000 mg | Freq: Once | INTRAMUSCULAR | Status: AC
  Administered 2015-10-16: 1 mg via INTRAVENOUS
  Filled 2015-10-16: qty 1

## 2015-10-16 MED ORDER — LOPERAMIDE HCL 2 MG PO TABS: 2.0000 mg | ORAL_TABLET | Freq: Four times a day (QID) | ORAL | Status: DC | PRN

## 2015-10-16 MED ORDER — LOPERAMIDE HCL 2 MG PO CAPS
4.0000 mg | ORAL_CAPSULE | Freq: Once | ORAL | Status: AC
  Administered 2015-10-16: 4 mg via ORAL
  Filled 2015-10-16: qty 2

## 2015-10-16 NOTE — ED Provider Notes (Signed)
[facility] Emergency Department Provider Note  Time seen: 3:44 PM  I have reviewed the triage vital signs and the nursing notes.   HISTORY  Chief Complaint Emesis    HPI Penny Shelton is a 40 y.o. female with a past medical history of hypertension, PTSD, presents the emergency department nausea, vomiting, diarrhea. According to the patient for the past 4 days she has been nauseated, with occasional dizzy spells, vomiting with very loose stool. Patient states she has had very frequent liquid diarrhea. Denies any black or bloody stool. Denies abdominal pain besides lower abdominal cramping but states she is on her menstrual cycle right now. Denies chest pain, cough, congestion.     Past Medical History  Diagnosis Date  . Hypertension   . PTSD (post-traumatic stress disorder)     Patient Active Problem List   Diagnosis Date Noted  . PTSD 12/07/2008  . WEIGHT LOSS 12/03/2008  . HYPERTENSION, BENIGN 07/24/2005    Past Surgical History  Procedure Laterality Date  . Cesarean section      Current Outpatient Rx  Name  Route  Sig  Dispense  Refill  . AMLODIPINE BESY-BENAZEPRIL HCL PO   Oral   Take 1 tablet by mouth daily.         . hydrochlorothiazide (HYDRODIURIL) 25 MG tablet   Oral   Take 25 mg by mouth daily.         Marland Kitchen PARoxetine HCl (PAXIL PO)   Oral   Take 1 tablet by mouth daily.         Marland Kitchen POTASSIUM CHLORIDE PO   Oral   Take 1 tablet by mouth daily. Take on mondays, wednesdays and fridays           Allergies Review of patient's allergies indicates no known allergies.  History reviewed. No pertinent family history.  Social History Social History  Substance Use Topics  . Smoking status: Never Smoker   . Smokeless tobacco: None  . Alcohol Use: No    Review of Systems Constitutional: Negative for fever. Cardiovascular: Negative for chest pain. Respiratory: Negative for shortness of breath. Gastrointestinal: Mild  lower abdominal cramping. Positive for nausea, vomiting, diarrhea Genitourinary: Negative for dysuria. Skin: Negative for rash. Neurological: Negative for headache 10-point ROS otherwise negative.  ____________________________________________   PHYSICAL EXAM:  VITAL SIGNS: ED Triage Vitals  Enc Vitals Group     BP 10/16/15 1253 185/120 mmHg     Pulse Rate 10/16/15 1253 92     Resp 10/16/15 1253 18     Temp 10/16/15 1253 98.4 F (36.9 C)     Temp Source 10/16/15 1253 Oral     SpO2 10/16/15 1253 98 %     Weight 10/16/15 1253 130 lb (58.968 kg)     Height 10/16/15 1253  (1.651 m)     Head Cir --      Peak Flow --      Pain Score 10/16/15 1253 10     Pain Loc --      Pain Edu? --      Excl. in GC? --     Constitutional: Alert and oriented. Well appearing and in no distress. Eyes: Normal exam ENT   Head: Normocephalic and atraumatic.   Mouth/Throat: Mucous membranes are moist. Cardiovascular: Normal rate, regular rhythm. No murmur Respiratory: Normal respiratory effort without tachypnea nor retractions. Breath sounds are clear  Gastrointestinal: Soft, minimal suprapubic tenderness palpation. No rebound or guarding. No distention. Musculoskeletal: Nontender with  normal range of motion in all extremities.  Neurologic:  Normal speech and language. No gross focal neurologic deficits  Psychiatric: Mood and affect are normal. Speech and behavior are normal.  ____________________________________________   INITIAL IMPRESSION / ASSESSMENT AND PLAN / ED COURSE  Pertinent labs & imaging results that were available during my care of the patient were reviewed by me and considered in my medical decision making (see chart for details).  Overall well-appearing patient with complaints of nausea, vomiting, diarrhea. States she get dizzy at home today so her son brought her to the emergency department for evaluation. Patient symptoms are very suggestive of gastroenteritis. Blood  work shows largely normal results, currently awaiting urinalysis. We will treat with IV fluids. Patient has received Zofran feels somewhat better but continues to feel very "shaky" and somewhat nauseated. We'll dose 1 mg of Ativan, dose loperamide, dose additional IV fluids and continue to monitor in the emergency department.  Labs within normal limits including urinalysis. Patient states she is feeling better this somewhat dizzy but denies nausea currently. We will discharge with Phenergan and loperamide, instructed the patient drink plenty of fluids, return to the emergency department for any worsening symptoms. Patient agreeable plan.  ____________________________________________   FINAL CLINICAL IMPRESSION(S) / ED DIAGNOSES  Nausea, vomiting, diarrhea   Minna AntisKevin Megha Agnes, MD 10/16/15 1721

## 2015-10-16 NOTE — Discharge Instructions (Signed)

## 2015-10-16 NOTE — ED Notes (Signed)
Pt to ed with c/o vomiting and diarrhea x 4 days.  Reports decreased appetite.

## 2015-11-28 ENCOUNTER — Encounter: Payer: Self-pay | Admitting: *Deleted

## 2015-11-28 ENCOUNTER — Emergency Department
Admission: EM | Admit: 2015-11-28 | Discharge: 2015-11-28 | Disposition: A | Discharge status: Home / Self Care | Payer: BLUE CROSS/BLUE SHIELD | Attending: Emergency Medicine | Admitting: Emergency Medicine

## 2015-11-28 DIAGNOSIS — F1299 Cannabis use, unspecified with unspecified cannabis-induced disorder: Secondary | ICD-10-CM | POA: Insufficient documentation

## 2015-11-28 DIAGNOSIS — I10 Essential (primary) hypertension: Secondary | ICD-10-CM | POA: Insufficient documentation

## 2015-11-28 DIAGNOSIS — H109 Unspecified conjunctivitis: Secondary | ICD-10-CM

## 2015-11-28 DIAGNOSIS — H1031 Unspecified acute conjunctivitis, right eye: Secondary | ICD-10-CM | POA: Insufficient documentation

## 2015-11-28 MED ORDER — EYE WASH OPHTH SOLN
1.0000 [drp] | OPHTHALMIC | Status: DC | PRN
  Filled 2015-11-28: qty 118

## 2015-11-28 MED ORDER — PREDNISONE 20 MG PO TABS: ORAL_TABLET | ORAL | Status: DC

## 2015-11-28 MED ORDER — FLUORESCEIN SODIUM 1 MG OP STRP
1.0000 | ORAL_STRIP | Freq: Once | OPHTHALMIC | Status: AC
  Administered 2015-11-28: 1 via OPHTHALMIC
  Filled 2015-11-28: qty 1

## 2015-11-28 MED ORDER — TETRACAINE HCL 0.5 % OP SOLN
2.0000 [drp] | Freq: Once | OPHTHALMIC | Status: AC
  Administered 2015-11-28: 2 [drp] via OPHTHALMIC
  Filled 2015-11-28: qty 2

## 2015-11-28 MED ORDER — ERYTHROMYCIN 5 MG/GM OP OINT
TOPICAL_OINTMENT | Freq: Once | OPHTHALMIC | Status: AC
  Administered 2015-11-28: 08:00:00 via OPHTHALMIC
  Filled 2015-11-28: qty 1

## 2015-11-28 MED ORDER — ERYTHROMYCIN 5 MG/GM OP OINT: 1.0000 "application " | TOPICAL_OINTMENT | Freq: Four times a day (QID) | OPHTHALMIC | Status: DC

## 2015-11-28 NOTE — ED Notes (Signed)
Pt verbalized understanding of discharge instructions. NAD at this time. 

## 2015-11-28 NOTE — Discharge Instructions (Signed)
How to Use Eye Drops and Eye Ointments  HOW TO APPLY EYE DROPS  Follow these steps when applying eye drops:  1. Wash your hands.  2. Tilt your head back.  3. Put a finger under your eye and use it to gently pull your lower lid downward. Keep that finger in place.  4. Using your other hand, hold the dropper between your thumb and index finger.  5. Position the dropper just over the edge of the lower lid. Hold it as close to your eye as you can without touching the dropper to your eye.  6. Steady your hand. One way to do this is to lean your index finger against your brow.  7. Look up.  8. Slowly and gently squeeze one drop of medicine into your eye.  9. Close your eye.  10. Place a finger between your lower eyelid and your nose. Press gently for 2 minutes. This increases the amount of time that the medicine is exposed to the eye. It also reduces side effects that can develop if the drop gets into the bloodstream through the nose.  HOW TO APPLY EYE OINTMENTS  Follow these steps when applying eye ointments:  1. Wash your hands.  2. Put a finger under your eye and use it to gently pull your lower lid downward. Keep that finger in place.  3. Using your other hand, place the tip of the tube between your thumb and index finger with the remaining fingers braced against your cheek or nose.  4. Hold the tube just over the edge of your lower lid without touching the tube to your lid or eyeball.  5. Look up.  6. Line the inner part of your lower lid with ointment.  7. Gently pull up on your upper lid and look down. This will force the ointment to spread over the surface of the eye.  8. Release the upper lid.  9. If you can, close your eyes for 1-2 minutes.  Do not rub your eyes. If you applied the ointment correctly, your vision will be blurry for a few minutes. This is normal.  ADDITIONAL INFORMATION   Make sure to use the eye drops or ointment as told by your health care provider.   If you have been told to use both eye  drops and an eye ointment, apply the eye drops first, then wait 3-4 minutes before you apply the ointment.   Try not to touch the tip of the dropper or tube to your eye. A dropper or tube that has touched the eye can become contaminated.     This information is not intended to replace advice given to you by your health care provider. Make sure you discuss any questions you have with your health care provider.     Document Released: 10/16/2000 Document Revised: 11/24/2014 Document Reviewed: 07/06/2014  [employer]vier Interactive Patient Education 2016 [employer]

## 2015-11-28 NOTE — ED Notes (Addendum)
Pt wore colored contact lenses last night. Pt c/o pain, redness, swelling.  Pt did not take HTN MEDS this morning.

## 2015-11-28 NOTE — ED Provider Notes (Signed)
[facility] Emergency Department Provider Note  ____________________________________________  Time seen: Approximately 7:21 AM  I have reviewed the triage vital signs and the nursing notes.   HISTORY  Chief Complaint Eye Pain    HPI Penny Shelton is a 40 y.o. female , NAD, presents to the emergency department with several hour history of right eye redness, pain and swelling. States the irritation began last night when she removed a colored contact lens. She is uncertain if she scratched her eye or if the contact lens caused irritation. Denies any injury or trauma to the eye. States she has been rubbing her eye due to the pain and irritation. Has had some clear discharge overnight. Has had some blurred vision but no loss of vision. Patient also notes that she did not take her antihypertensive medications today. She denies any chest pain, shortness breath, headache, numbness, weakness, tingling.   Past Medical History  Diagnosis Date  . Hypertension   . PTSD (post-traumatic stress disorder)     Patient Active Problem List   Diagnosis Date Noted  . PTSD 12/07/2008  . WEIGHT LOSS 12/03/2008  . HYPERTENSION, BENIGN 07/24/2005    Past Surgical History  Procedure Laterality Date  . Cesarean section      Current Outpatient Rx  Name  Route  Sig  Dispense  Refill  . AMLODIPINE BESY-BENAZEPRIL HCL PO   Oral   Take 1 tablet by mouth daily.         Marland Kitchen. erythromycin ophthalmic ointment   Left Eye   Place 1 application into the left eye 4 (four) times daily. Apply 1cm ribbon to lower eyelid 4 times daily.   3.5 g   0   . hydrochlorothiazide (HYDRODIURIL) 25 MG tablet   Oral   Take 25 mg by mouth daily.         Marland Kitchen. loperamide (IMODIUM A-D) 2 MG tablet   Oral   Take 1 tablet (2 mg total) by mouth 4 (four) times daily as needed for diarrhea or loose stools.   20 tablet   0   . PARoxetine HCl (PAXIL PO)   Oral   Take 1 tablet by mouth daily.          Marland Kitchen. POTASSIUM CHLORIDE PO   Oral   Take 1 tablet by mouth daily. Take on mondays, wednesdays and fridays         . predniSONE (DELTASONE) 20 MG tablet      Take 2 tablets by mouth, once daily, for 5 days   10 tablet   0   . promethazine (PHENERGAN) 25 MG tablet   Oral   Take 1 tablet (25 mg total) by mouth every 6 (six) hours as needed for nausea or vomiting.   30 tablet   0     Allergies Review of patient's allergies indicates no known allergies.  History reviewed. No pertinent family history.  Social History Social History  Substance Use Topics  . Smoking status: Never Smoker   . Smokeless tobacco: None  . Alcohol Use: No     Review of Systems  Constitutional: No fever/chills Eyes: Positive blurry vision, right eye erythema and clear drainage. Positive swelling about upper and lower eyelids of the right eye. Left eye without any abnormalities. ENT: No sore throat, nasal congestion. Cardiovascular: No chest pain. Respiratory: No shortness of breath.  Musculoskeletal: Negative for neck pain.  Skin: Negative for rash, skin sores, open wounds. Neurological: Negative for headaches, focal weakness or  numbness. 10-point ROS otherwise negative.  ____________________________________________   PHYSICAL EXAM:  VITAL SIGNS: ED Triage Vitals  Enc Vitals Group     BP 11/28/15 0701 171/119 mmHg     Pulse Rate 11/28/15 0701 77     Resp 11/28/15 0701 24     Temp 11/28/15 0701 98.3 F (36.8 C)     Temp Source 11/28/15 0701 Oral     SpO2 11/28/15 0701 100 %     Weight --      Height --      Head Cir --      Peak Flow --      Pain Score 11/28/15 0702 10     Pain Loc --      Pain Edu? --      Excl. in GC? --      Constitutional: Alert and oriented. Well appearing and in no acute distress. Eyes: No fluoroscein uptake noted about the right eye. Conjunctiva of the right eye is moderately injected. Clear tearing of the right eye is noted. No evidence of foreign  body with inversion of eyelids. Upper and lower eyelids of the right eye with moderate injection and edema. Left conjunctiva are normal. PERRLA. EOMI without pain.  Head: Atraumatic. Neck: Supple with full range of motion. Hematological/Lymphatic/Immunilogical: No cervical lymphadenopathy. Respiratory: Normal respiratory effort without tachypnea or retractions.  Neurologic:  Normal speech and language. No gross focal neurologic deficits are appreciated.  Skin:  Skin is warm, dry and intact. No rash noted. Psychiatric: Mood and affect are normal. Speech and behavior are normal. Patient exhibits appropriate insight and judgement.   ____________________________________________   LABS  None ____________________________________________  EKG  None ____________________________________________  RADIOLOGY  None ____________________________________________    PROCEDURES  Procedure(s) performed:   Right eye irrigated with 250 mL of normal saline through Morgan's lens. Patient reports relief.      Medications  eye wash ((SODIUM/POTASSIUM/SOD CHLORIDE)) ophthalmic solution 1 drop (not administered)  tetracaine (PONTOCAINE) 0.5 % ophthalmic solution 2 drop (2 drops Both Eyes Given 11/28/15 0820)  fluorescein ophthalmic strip 1 strip (1 strip Both Eyes Given 11/28/15 0820)  erythromycin ophthalmic ointment ( Right Eye Given 11/28/15 0820)     ____________________________________________   INITIAL IMPRESSION / ASSESSMENT AND PLAN / ED COURSE  Patient's diagnosis is consistent with Conjunctivitis of the right eye. Patient will be discharged home with prescriptions for erythromycin ophthalmic ointment as well as prednisone to take as directed. Patient is to follow up with [facility] tomorrow for recheck. Patient was given a work note to excuse from work today and tomorrow so that she may follow up with an ophthalmologist. Patient is given ED precautions to return to the ED for  any worsening or new symptoms.    ____________________________________________  FINAL CLINICAL IMPRESSION(S) / ED DIAGNOSES  Final diagnoses:  Conjunctivitis of right eye      NEW MEDICATIONS STARTED DURING THIS VISIT:  Discharge Medication List as of 11/28/2015  8:11 AM    START taking these medications   Details  erythromycin ophthalmic ointment Place 1 application into the left eye 4 (four) times daily. Apply 1cm ribbon to lower eyelid 4 times daily., Starting 11/28/2015, Until Discontinued, Print    predniSONE (DELTASONE) 20 MG tablet Take 2 tablets by mouth, once daily, for 5 days, Print             Hope Pigeon, PA-C 11/28/15 0912  Jene Every, MD 11/28/15 1128

## 2016-05-10 ENCOUNTER — Emergency Department: Payer: Medicaid Other

## 2016-05-10 ENCOUNTER — Encounter: Payer: Self-pay | Admitting: Emergency Medicine

## 2016-05-10 ENCOUNTER — Emergency Department
Admission: EM | Admit: 2016-05-10 | Discharge: 2016-05-10 | Disposition: A | Discharge status: Home / Self Care | Payer: Medicaid Other | Attending: Student in an Organized Health Care Education/Training Program | Admitting: Student in an Organized Health Care Education/Training Program

## 2016-05-10 DIAGNOSIS — S0003XA Contusion of scalp, initial encounter: Secondary | ICD-10-CM

## 2016-05-10 DIAGNOSIS — Y999 Unspecified external cause status: Secondary | ICD-10-CM | POA: Insufficient documentation

## 2016-05-10 DIAGNOSIS — Y929 Unspecified place or not applicable: Secondary | ICD-10-CM | POA: Insufficient documentation

## 2016-05-10 DIAGNOSIS — S0083XA Contusion of other part of head, initial encounter: Secondary | ICD-10-CM

## 2016-05-10 DIAGNOSIS — S161XXA Strain of muscle, fascia and tendon at neck level, initial encounter: Secondary | ICD-10-CM

## 2016-05-10 DIAGNOSIS — F4310 Post-traumatic stress disorder, unspecified: Secondary | ICD-10-CM | POA: Insufficient documentation

## 2016-05-10 DIAGNOSIS — S93402A Sprain of unspecified ligament of left ankle, initial encounter: Secondary | ICD-10-CM | POA: Insufficient documentation

## 2016-05-10 DIAGNOSIS — Y9389 Activity, other specified: Secondary | ICD-10-CM | POA: Insufficient documentation

## 2016-05-10 DIAGNOSIS — I10 Essential (primary) hypertension: Secondary | ICD-10-CM | POA: Insufficient documentation

## 2016-05-10 DIAGNOSIS — Z79899 Other long term (current) drug therapy: Secondary | ICD-10-CM | POA: Insufficient documentation

## 2016-05-10 DIAGNOSIS — IMO0001 Reserved for inherently not codable concepts without codable children: Secondary | ICD-10-CM

## 2016-05-10 DIAGNOSIS — Z760 Encounter for issue of repeat prescription: Secondary | ICD-10-CM | POA: Insufficient documentation

## 2016-05-10 DIAGNOSIS — S20219A Contusion of unspecified front wall of thorax, initial encounter: Secondary | ICD-10-CM | POA: Insufficient documentation

## 2016-05-10 DIAGNOSIS — Z7952 Long term (current) use of systemic steroids: Secondary | ICD-10-CM | POA: Insufficient documentation

## 2016-05-10 LAB — POCT PREGNANCY, URINE: Preg Test, Ur: NEGATIVE

## 2016-05-10 MED ORDER — HYDROCHLOROTHIAZIDE 25 MG PO TABS: 25.0000 mg | ORAL_TABLET | Freq: Every day | ORAL | 0 refills | Status: DC

## 2016-05-10 MED ORDER — CYCLOBENZAPRINE HCL 10 MG PO TABS: 10.0000 mg | ORAL_TABLET | Freq: Three times a day (TID) | ORAL | 0 refills | Status: DC | PRN

## 2016-05-10 MED ORDER — TRAMADOL HCL 50 MG PO TABS
50.0000 mg | ORAL_TABLET | Freq: Once | ORAL | Status: AC
  Administered 2016-05-10: 50 mg via ORAL
  Filled 2016-05-10: qty 1

## 2016-05-10 MED ORDER — TRAMADOL HCL 50 MG PO TABS: 50.0000 mg | ORAL_TABLET | Freq: Four times a day (QID) | ORAL | 0 refills | Status: AC | PRN

## 2016-05-10 MED ORDER — POTASSIUM CHLORIDE ER 10 MEQ PO TBCR: 10.0000 meq | EXTENDED_RELEASE_TABLET | ORAL | 0 refills | Status: DC

## 2016-05-10 MED ORDER — CYCLOBENZAPRINE HCL 10 MG PO TABS
10.0000 mg | ORAL_TABLET | Freq: Once | ORAL | Status: AC
  Administered 2016-05-10: 10 mg via ORAL
  Filled 2016-05-10: qty 1

## 2016-05-10 MED ORDER — AMLODIPINE BESY-BENAZEPRIL HCL 5-10 MG PO CAPS: 1.0000 | ORAL_CAPSULE | Freq: Every day | ORAL | 0 refills | Status: DC
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
## 2016-05-10 NOTE — ED Notes (Signed)
Pt not in room at this time
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
## 2016-05-10 NOTE — ED Notes (Addendum)
Pt alert and oriented X4, active, cooperative, pt in NAD. RR even and unlabored, color WNL.  Pt informed to return if any life threatening symptoms occur.    Pt requesting refill of her BP medications.
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
## 2016-11-12 ENCOUNTER — Emergency Department: Payer: Self-pay

## 2016-11-12 ENCOUNTER — Emergency Department
Admission: EM | Admit: 2016-11-12 | Discharge: 2016-11-12 | Disposition: A | Discharge status: Home / Self Care | Payer: Self-pay | Attending: Emergency Medicine | Admitting: Emergency Medicine

## 2016-11-12 ENCOUNTER — Encounter: Payer: Self-pay | Admitting: Radiology

## 2016-11-12 DIAGNOSIS — I10 Essential (primary) hypertension: Secondary | ICD-10-CM | POA: Insufficient documentation

## 2016-11-12 DIAGNOSIS — Z79899 Other long term (current) drug therapy: Secondary | ICD-10-CM | POA: Insufficient documentation

## 2016-11-12 DIAGNOSIS — R109 Unspecified abdominal pain: Secondary | ICD-10-CM

## 2016-11-12 DIAGNOSIS — R102 Pelvic and perineal pain: Secondary | ICD-10-CM | POA: Insufficient documentation

## 2016-11-12 DIAGNOSIS — R1031 Right lower quadrant pain: Secondary | ICD-10-CM | POA: Insufficient documentation

## 2016-11-12 LAB — CBC
HEMATOCRIT: 38.6 % (ref 35.0–47.0)
HEMOGLOBIN: 12.9 g/dL (ref 12.0–16.0)
MCH: 29.3 pg (ref 26.0–34.0)
MCHC: 33.6 g/dL (ref 32.0–36.0)
MCV: 87.3 fL (ref 80.0–100.0)
Platelets: 259 10*3/uL (ref 150–440)
RBC: 4.42 MIL/uL (ref 3.80–5.20)
RDW: 14.3 % (ref 11.5–14.5)
WBC: 5.6 10*3/uL (ref 3.6–11.0)

## 2016-11-12 LAB — COMPREHENSIVE METABOLIC PANEL
ALBUMIN: 4.5 g/dL (ref 3.5–5.0)
ALK PHOS: 43 U/L (ref 38–126)
ALT: 13 U/L — ABNORMAL LOW (ref 14–54)
ANION GAP: 7 (ref 5–15)
AST: 26 U/L (ref 15–41)
BUN: 10 mg/dL (ref 6–20)
CALCIUM: 8.9 mg/dL (ref 8.9–10.3)
CHLORIDE: 104 mmol/L (ref 101–111)
CO2: 26 mmol/L (ref 22–32)
Creatinine, Ser: 0.84 mg/dL (ref 0.44–1.00)
GFR calc Af Amer: >60 mL/min (ref >60)
GFR calc non Af Amer: >60 mL/min (ref >60)
GLUCOSE: 92 mg/dL (ref 65–99)
Potassium: 3.4 mmol/L — ABNORMAL LOW (ref 3.5–5.1)
SODIUM: 137 mmol/L (ref 135–145)
Total Bilirubin: 0.5 mg/dL (ref 0.3–1.2)
Total Protein: 8 g/dL (ref 6.5–8.1)

## 2016-11-12 LAB — URINALYSIS, COMPLETE (UACMP) WITH MICROSCOPIC
BACTERIA UA: NONE SEEN
Bilirubin Urine: NEGATIVE
GLUCOSE, UA: NEGATIVE mg/dL
KETONES UR: NEGATIVE mg/dL
Leukocytes, UA: NEGATIVE
Nitrite: NEGATIVE
PROTEIN: NEGATIVE mg/dL
Specific Gravity, Urine: 1.024 (ref 1.005–1.030)
pH: 6 (ref 5.0–8.0)

## 2016-11-12 LAB — POCT PREGNANCY, URINE: PREG TEST UR: NEGATIVE

## 2016-11-12 LAB — LIPASE, BLOOD: Lipase: 23 U/L (ref 11–51)

## 2016-11-12 MED ORDER — OXYCODONE-ACETAMINOPHEN 5-325 MG PO TABS
ORAL_TABLET | ORAL | Status: AC
  Administered 2016-11-12: 1 via ORAL
  Filled 2016-11-12: qty 1

## 2016-11-12 MED ORDER — ONDANSETRON HCL 4 MG/2ML IJ SOLN
INTRAMUSCULAR | Status: AC
  Administered 2016-11-12: 4 mg via INTRAVENOUS
  Filled 2016-11-12: qty 2

## 2016-11-12 MED ORDER — IOPAMIDOL (ISOVUE-300) INJECTION 61%
30.0000 mL | Freq: Once | INTRAVENOUS | Status: AC | PRN
  Administered 2016-11-12: 30 mL via ORAL

## 2016-11-12 MED ORDER — IOPAMIDOL (ISOVUE-300) INJECTION 61%
100.0000 mL | Freq: Once | INTRAVENOUS | Status: AC | PRN
  Administered 2016-11-12: 100 mL via INTRAVENOUS

## 2016-11-12 MED ORDER — DICYCLOMINE HCL 20 MG PO TABS: 20.0000 mg | ORAL_TABLET | Freq: Three times a day (TID) | ORAL | 0 refills | Status: DC | PRN

## 2016-11-12 MED ORDER — ONDANSETRON HCL 4 MG/2ML IJ SOLN
4.0000 mg | Freq: Once | INTRAMUSCULAR | Status: AC
  Administered 2016-11-12: 4 mg via INTRAVENOUS

## 2016-11-12 MED ORDER — OXYCODONE-ACETAMINOPHEN 5-325 MG PO TABS
1.0000 | ORAL_TABLET | Freq: Once | ORAL | Status: AC
  Administered 2016-11-12: 1 via ORAL

## 2016-11-12 MED ORDER — ONDANSETRON HCL 4 MG PO TABS: 4.0000 mg | ORAL_TABLET | Freq: Three times a day (TID) | ORAL | 0 refills | Status: DC | PRN

## 2016-11-12 NOTE — ED Notes (Signed)
FIRST NURSE NOTE: C/o RLQ abdominal pain that started last night. Tender to touch pain gets better when standing.

## 2016-11-12 NOTE — Discharge Instructions (Signed)
Please seek medical attention for any high fevers, chest pain, shortness of breath, change in behavior, persistent vomiting, bloody stool or any other new or concerning symptoms.  

## 2016-11-12 NOTE — ED Notes (Signed)
Pt being transported to CT by Kat, CT tech 

## 2016-11-12 NOTE — ED Notes (Signed)
Pt ambulatory to room. Pt states that she is having RLQ abdominal pain that started last night around midnight. Pt states that the pain "feels like contractions". Pt thought it was related to her period because she was spotting but that has since stopped. Pt denies N/V/D, fever or chills. Pt states that she had 1 episode of urinating on herself 2 days ago. Denies any burning or frequency with urination.  Pt states that the nurse at her work gave her a total of  Ibuprofen.

## 2016-11-12 NOTE — ED Provider Notes (Signed)
[facility] Emergency Department Provider Note   ____________________________________________   I have reviewed the triage vital signs and the nursing notes.   HISTORY  Chief Complaint Abdominal Pain   History limited by: Not Limited   HPI Penny Shelton is a 41 y.o. female who presents to the emergency department today because of concern for abdominal pain. The pain is located in the right lower quadrant. The pain started last night and woke the patient from sleep. It has been severe. It has been constant. She describes it as being similar to labor pains. The patient states that she is supposed to be starting her menstrual cycle but only noticed a small amount of blood yesterday. No blood today. The patient has not had any associated nausea, vomiting or fevers. No change in urination or defecation since the pain has started.     Past Medical History:  Diagnosis Date  . Hypertension   . PTSD (post-traumatic stress disorder)     Patient Active Problem List   Diagnosis Date Noted  . PTSD 12/07/2008  . WEIGHT LOSS 12/03/2008  . HYPERTENSION, BENIGN 07/24/2005    Past Surgical History:  Procedure Laterality Date  . CESAREAN SECTION      Prior to Admission medications   Medication Sig Start Date End Date Taking? Authorizing Provider  AMLODIPINE BESY-BENAZEPRIL HCL PO Take 1 tablet by mouth daily.    Historical Provider, MD  amLODipine-benazepril (LOTREL) 5-10 MG capsule Take 1 capsule by mouth daily. 05/10/16   Joni Reining, PA-C  cyclobenzaprine (FLEXERIL) 10 MG tablet Take 1 tablet (10 mg total) by mouth 3 (three) times daily as needed. 05/10/16   Joni Reining, PA-C  erythromycin ophthalmic ointment Place 1 application into the left eye 4 (four) times daily. Apply 1cm ribbon to lower eyelid 4 times daily. 11/28/15   Jami L Hagler, PA-C  hydrochlorothiazide (HYDRODIURIL) 25 MG tablet Take 25 mg by mouth daily.    Historical Provider, MD   hydrochlorothiazide (HYDRODIURIL) 25 MG tablet Take 1 tablet (25 mg total) by mouth daily. 05/10/16   Joni Reining, PA-C  loperamide (IMODIUM A-D) 2 MG tablet Take 1 tablet (2 mg total) by mouth 4 (four) times daily as needed for diarrhea or loose stools. 10/16/15   Minna Antis, MD  PARoxetine HCl (PAXIL PO) Take 1 tablet by mouth daily.    Historical Provider, MD  potassium chloride (K-DUR) 10 MEQ tablet Take 1 tablet (10 mEq total) by mouth as directed. Take on Monday, Wednesday, and Friday. 05/10/16   Joni Reining, PA-C  POTASSIUM CHLORIDE PO Take 1 tablet by mouth daily. Take on mondays, wednesdays and fridays    Historical Provider, MD  predniSONE (DELTASONE) 20 MG tablet Take 2 tablets by mouth, once daily, for 5 days 11/28/15   Jami L Hagler, PA-C  promethazine (PHENERGAN) 25 MG tablet Take 1 tablet (25 mg total) by mouth every 6 (six) hours as needed for nausea or vomiting. 10/16/15   Minna Antis, MD  traMADol (ULTRAM) 50 MG tablet Take 1 tablet (50 mg total) by mouth every 6 (six) hours as needed. 05/10/16 05/10/17  Joni Reining, PA-C    Allergies Patient has no known allergies.  No family history on file.  Social History Social History  Substance Use Topics  . Smoking status: Never Smoker  . Smokeless tobacco: Never Used  . Alcohol use No    Review of Systems  Constitutional: Negative for fever. Cardiovascular: Negative for chest  pain. Respiratory: Negative for shortness of breath. Gastrointestinal: Positive for right lower quadrant abdominal pain. Genitourinary: Negative for dysuria. Musculoskeletal: Negative for back pain. Skin: Negative for rash. Neurological: Negative for headaches, focal weakness or numbness.  10-point ROS otherwise negative.  ____________________________________________   PHYSICAL EXAM:  VITAL SIGNS: ED Triage Vitals [11/12/16 0806]  Enc Vitals Group     BP (!) 189/114     Pulse Rate 80     Resp 18     Temp 98.2 F (36.8  C)     Temp Source Oral     SpO2 100 %     Weight 120 lb (54.4 kg)     Height  (1.651 m)     Head Circumference     Constitutional: Alert and oriented. Well appearing and in no distress. Eyes: Conjunctivae are normal. Normal extraocular movements. ENT   Head: Normocephalic and atraumatic.   Nose: No congestion/rhinnorhea.   Mouth/Throat: Mucous membranes are moist.   Neck: No stridor. Hematological/Lymphatic/Immunilogical: No cervical lymphadenopathy. Cardiovascular: Normal rate, regular rhythm.  No murmurs, rubs, or gallops.  Respiratory: Normal respiratory effort without tachypnea nor retractions. Breath sounds are clear and equal bilaterally. No wheezes/rales/rhonchi. Gastrointestinal: Soft and tender to palpation in the right lower quadrant.  Genitourinary: Deferred Musculoskeletal: Normal range of motion in all extremities. No lower extremity edema. Neurologic:  Normal speech and language. No gross focal neurologic deficits are appreciated.  Skin:  Skin is warm, dry and intact. No rash noted. Psychiatric: Mood and affect are normal. Speech and behavior are normal. Patient exhibits appropriate insight and judgment.  ____________________________________________    LABS (pertinent positives/negatives)  Labs Reviewed  COMPREHENSIVE METABOLIC PANEL - Abnormal; Notable for the following:       Result Value   Potassium 3.4 (*)    ALT 13 (*)    All other components within normal limits  URINALYSIS, COMPLETE (UACMP) WITH MICROSCOPIC - Abnormal; Notable for the following:    Color, Urine YELLOW (*)    APPearance HAZY (*)    Hgb urine dipstick MODERATE (*)    Squamous Epithelial / LPF 6-30 (*)    All other components within normal limits  LIPASE, BLOOD  CBC  POC URINE PREG, ED  POCT PREGNANCY, URINE     ____________________________________________   EKG  None  ____________________________________________    RADIOLOGY  CT abd/pel IMPRESSION:   Negative for appendicitis or other acute abnormality.    Atherosclerosis.    US IMPRESSION:  No acute or significant findings. No evidence of ovarian torsion.       ____________________________________________   PROCEDURES  Procedures  ____________________________________________   INITIAL IMPRESSION / ASSESSMENT AND PLAN / ED COURSE  Pertinent labs & imaging results that were available during my care of the patient were reviewed by me and considered in my medical decision making (see chart for details).  Patient presented to the emergency department today because of concerns for right lower quadrant abdominal pain. Initial concern was for ovarian issue given lack of fever. Ultrasound however did not show any abnormal adnexal findings. Given negative ultrasound a CT scan was obtained which did not show any concerning findings. No signs of appendicitis. This point unclear etiology of the patient's pain however do think she is safe for discharge. I discussed return precautions.  ____________________________________________   FINAL CLINICAL IMPRESSION(S) / ED DIAGNOSES  Final diagnoses:  Pelvic pain  Abdominal pain, unspecified abdominal location     Note: This dictation was prepared with Dragon dictation.  Any transcriptional errors that result from this process are unintentional     Phineas Semen, MD 11/12/16 [phone]

## 2016-11-12 NOTE — ED Triage Notes (Signed)
Pt ambulatory to triage, arrived via POV with reports of RLQ abdominal pain that started last night and worse today. Reports she is supposed to start her period, but only started spotting yesterday. Tender to touch.

## 2018-07-20 ENCOUNTER — Emergency Department: Payer: Self-pay

## 2018-07-20 ENCOUNTER — Other Ambulatory Visit: Payer: Self-pay

## 2018-07-20 ENCOUNTER — Emergency Department
Admission: EM | Admit: 2018-07-20 | Discharge: 2018-07-20 | Disposition: A | Discharge status: Home / Self Care | Payer: Self-pay | Attending: Emergency Medicine | Admitting: Emergency Medicine

## 2018-07-20 ENCOUNTER — Encounter: Payer: Self-pay | Admitting: Emergency Medicine

## 2018-07-20 DIAGNOSIS — R0789 Other chest pain: Secondary | ICD-10-CM | POA: Insufficient documentation

## 2018-07-20 DIAGNOSIS — M940 Chondrocostal junction syndrome [Tietze]: Secondary | ICD-10-CM

## 2018-07-20 DIAGNOSIS — F17200 Nicotine dependence, unspecified, uncomplicated: Secondary | ICD-10-CM | POA: Insufficient documentation

## 2018-07-20 DIAGNOSIS — I10 Essential (primary) hypertension: Secondary | ICD-10-CM | POA: Insufficient documentation

## 2018-07-20 DIAGNOSIS — Z79899 Other long term (current) drug therapy: Secondary | ICD-10-CM | POA: Insufficient documentation

## 2018-07-20 DIAGNOSIS — F1210 Cannabis abuse, uncomplicated: Secondary | ICD-10-CM | POA: Insufficient documentation

## 2018-07-20 DIAGNOSIS — R071 Chest pain on breathing: Secondary | ICD-10-CM | POA: Insufficient documentation

## 2018-07-20 DIAGNOSIS — R0602 Shortness of breath: Secondary | ICD-10-CM | POA: Insufficient documentation

## 2018-07-20 LAB — BASIC METABOLIC PANEL
ANION GAP: 8 (ref 5–15)
BUN: 9 mg/dL (ref 6–20)
CO2: 24 mmol/L (ref 22–32)
Calcium: 9.1 mg/dL (ref 8.9–10.3)
Chloride: 105 mmol/L (ref 98–111)
Creatinine, Ser: 0.86 mg/dL (ref 0.44–1.00)
GFR calc non Af Amer: >60 mL/min (ref >60)
Glucose, Bld: 98 mg/dL (ref 70–99)
Potassium: 3.2 mmol/L — ABNORMAL LOW (ref 3.5–5.1)
Sodium: 137 mmol/L (ref 135–145)

## 2018-07-20 LAB — CBC
HCT: 38.6 % (ref 36.0–46.0)
Hemoglobin: 12.8 g/dL (ref 12.0–15.0)
MCH: 29.5 pg (ref 26.0–34.0)
MCHC: 33.2 g/dL (ref 30.0–36.0)
MCV: 88.9 fL (ref 80.0–100.0)
Platelets: 227 10*3/uL (ref 150–400)
RBC: 4.34 MIL/uL (ref 3.87–5.11)
RDW: 14 % (ref 11.5–15.5)
WBC: 5.4 10*3/uL (ref 4.0–10.5)
nRBC: 0 % (ref 0.0–0.2)

## 2018-07-20 LAB — TROPONIN I: Troponin I: <0.03 ng/mL (ref <0.03)

## 2018-07-20 LAB — FIBRIN DERIVATIVES D-DIMER (ARMC ONLY): FIBRIN DERIVATIVES D-DIMER (ARMC): 169.67 ng{FEU}/mL (ref 0.00–499.00)

## 2018-07-20 LAB — POCT PREGNANCY, URINE: Preg Test, Ur: NEGATIVE

## 2018-07-20 MED ORDER — SODIUM CHLORIDE 0.9 % IV BOLUS: 1000.0000 mL | Freq: Once | INTRAVENOUS | Status: DC

## 2018-07-20 MED ORDER — PREDNISONE 20 MG PO TABS: 40.0000 mg | ORAL_TABLET | Freq: Every day | ORAL | 0 refills | Status: DC

## 2018-07-20 MED ORDER — NAPROXEN 500 MG PO TABS: 500.0000 mg | ORAL_TABLET | Freq: Two times a day (BID) | ORAL | 0 refills | Status: DC

## 2018-07-20 MED ORDER — ONDANSETRON HCL 4 MG/2ML IJ SOLN
4.0000 mg | Freq: Once | INTRAMUSCULAR | Status: AC
  Administered 2018-07-20: 4 mg via INTRAVENOUS
  Filled 2018-07-20: qty 2

## 2018-07-20 MED ORDER — AMLODIPINE BESYLATE 5 MG PO TABS: 5.0000 mg | ORAL_TABLET | Freq: Every day | ORAL | 1 refills | Status: DC

## 2018-07-20 MED ORDER — KETOROLAC TROMETHAMINE 30 MG/ML IJ SOLN
15.0000 mg | INTRAMUSCULAR | Status: AC
  Administered 2018-07-20: 15 mg via INTRAVENOUS
  Filled 2018-07-20: qty 1

## 2018-07-20 MED ORDER — IOPAMIDOL (ISOVUE-370) INJECTION 76%
75.0000 mL | Freq: Once | INTRAVENOUS | Status: AC | PRN
  Administered 2018-07-20: 75 mL via INTRAVENOUS

## 2018-07-20 NOTE — Discharge Instructions (Addendum)
Your labs and chest x-ray look okay.  Your CT scan did not show any blood clots in the lungs or other serious issues.  This appears to be due to inflammation of your chest wall and the cartilage around your ribs.  Take anti-inflammatory medicine such as Aleve as well as daily prednisone for the next several days which should help control your symptoms.  Continue taking your blood pressure medicine and follow-up with primary care for continued monitoring of your symptoms.

## 2018-07-20 NOTE — ED Notes (Addendum)
Discussed withholding NS with Dr. Scotty CourtStafford due to elevated BP. Order discontinued. Will continue to monitor.

## 2018-07-20 NOTE — ED Notes (Signed)
Pt requested something to drink. Request discussed with Dr Scotty CourtStafford who approved. Water provided. Pt resting comfortably.

## 2018-07-20 NOTE — ED Notes (Signed)
Patient transported to CT 

## 2018-07-20 NOTE — ED Provider Notes (Signed)
[facility] Emergency Department Provider Note  ____________________________________________  Time seen: Approximately 9:26 AM  I have reviewed the triage vital signs and the nursing notes.   HISTORY  Chief Complaint Chest Pain    HPI Penny Shelton is a 42 y.o. female with a past medical history of hypertension who complains of central chest pain that started last night.  It is been constant since then.  Worse with movement and laying on her left side.  Worse with deep breathing.  Feels sharp and radiates through to her back.  Also has a feeling of shortness of breath.  Denies diaphoresis or vomiting.  Not exertional.  Denies any recent illness fevers or chills.  No known sick contacts.  She vaguely remembers a possible history of DVT about 13 years ago.  Denies any recent travel trauma hospitalization or surgery.      Past Medical History:  Diagnosis Date  . Hypertension   . PTSD (post-traumatic stress disorder)      Patient Active Problem List   Diagnosis Date Noted  . PTSD 12/07/2008  . WEIGHT LOSS 12/03/2008  . HYPERTENSION, BENIGN 07/24/2005     Past Surgical History:  Procedure Laterality Date  . CESAREAN SECTION       Prior to Admission medications   Medication Sig Start Date End Date Taking? Authorizing Provider  amLODipine-benazepril (LOTREL) 5-10 MG capsule Take 1 capsule by mouth daily. 05/10/16  Yes Joni ReiningSmith, Ronald K, PA-C  hydrochlorothiazide (HYDRODIURIL) 25 MG tablet Take 1 tablet (25 mg total) by mouth daily. 05/10/16  Yes Joni ReiningSmith, Ronald K, PA-C  LORazepam (ATIVAN) 2 MG tablet Take 2 mg by mouth at bedtime.   Yes [provider]  potassium chloride (K-DUR) 10 MEQ tablet Take 1 tablet (10 mEq total) by mouth as directed. Take on Monday, Wednesday, and Friday. 05/10/16  Yes Joni ReiningSmith, Ronald K, PA-C  naproxen (NAPROSYN) 500 MG tablet Take 1 tablet (500 mg total) by mouth 2 (two) times daily with a meal. 07/20/18   Sharman CheekStafford,  Deveney Bayon, MD  predniSONE (DELTASONE) 20 MG tablet Take 2 tablets (40 mg total) by mouth daily. 07/20/18   Sharman CheekStafford, Avi Archuleta, MD     Allergies Patient has no known allergies.   No family history on file.  Social History Social History   Tobacco Use  . Smoking status: Current Every Day Smoker  . Smokeless tobacco: Never Used  Substance Use Topics  . Alcohol use: No  . Drug use: Yes    Types: Marijuana    Review of Systems  Constitutional:   No fever or chills.  ENT:   No sore throat. No rhinorrhea. Cardiovascular:   Positive as above chest pain without syncope. Respiratory: Positive shortness of breath and nonproductive cough. Gastrointestinal:   Negative for abdominal pain, vomiting and diarrhea.  Musculoskeletal:   Negative for focal pain or swelling All other systems reviewed and are negative except as documented above in ROS and HPI.  ____________________________________________   PHYSICAL EXAM:  VITAL SIGNS: ED Triage Vitals  Enc Vitals Group     BP 07/20/18 0729 (!) 209/134     Pulse Rate 07/20/18 0729 (!) 102     Resp 07/20/18 0729 16     Temp 07/20/18 0729 98.6 F (37 C)     Temp Source 07/20/18 0729 Oral     SpO2 07/20/18 0729 100 %     Weight 07/20/18 0730 125 lb (56.7 kg)     Height 07/20/18 0730 5\' 5"  (1.651  m)     Head Circumference --      Peak Flow --      Pain Score 07/20/18 0730 10     Pain Loc --      Pain Edu? --      Excl. in GC? --     Vital signs reviewed, nursing assessments reviewed.   Constitutional:   Alert and oriented. Non-toxic appearance. Eyes:   Conjunctivae are normal. EOMI. PERRL. ENT      Head:   Normocephalic and atraumatic.      Nose:   No congestion/rhinnorhea.       Mouth/Throat:   MMM, no pharyngeal erythema. No peritonsillar mass.       Neck:   No meningismus. Full ROM. Hematological/Lymphatic/Immunilogical:   No cervical lymphadenopathy. Cardiovascular:   RRR. Symmetric bilateral radial and DP pulses.  No  murmurs. Cap refill less than 2 seconds. Respiratory:   Normal respiratory effort without tachypnea/retractions. Breath sounds are clear and equal bilaterally. No wheezes/rales/rhonchi. Gastrointestinal:   Soft and nontender. Non distended. There is no CVA tenderness.  No rebound, rigidity, or guarding.  Musculoskeletal:   Normal range of motion in all extremities. No joint effusions.  No lower extremity tenderness.  No edema.  Pronounced tenderness diffusely over the sternum reproducing symptoms. Neurologic:   Normal speech and language.  Motor grossly intact. No acute focal neurologic deficits are appreciated.  Skin:    Skin is warm, dry and intact. No rash noted.  No petechiae, purpura, or bullae.  ____________________________________________    LABS (pertinent positives/negatives) (all labs ordered are listed, but only abnormal results are displayed) Labs Reviewed  BASIC METABOLIC PANEL - Abnormal; Notable for the following components:      Result Value   Potassium 3.2 (*)    All other components within normal limits  CBC  TROPONIN I  FIBRIN DERIVATIVES D-DIMER ([facility] ONLY)  POC URINE PREG, ED  POCT PREGNANCY, URINE   ____________________________________________   EKG  Interpreted by me Sinus tachycardia rate 105, normal axis and intervals.  Evidence of LVH.  Normal ST segments.  T wave inversions in the inferior leads.  Repeat EKG performed at 8:39 AM Sinus rhythm rate of 68, normal axis intervals QRS ST segments and T waves.  T wave inversions are now resolved.  No Q waves or delta waves  ____________________________________________    RADIOLOGY  Dg Chest 2 View  Result Date: 07/20/2018 CLINICAL DATA:  Left-sided chest pain. EXAM: CHEST - 2 VIEW COMPARISON:  Chest radiograph 05/10/2016 FINDINGS: Normal cardiac and mediastinal contours. No consolidative pulmonary opacities. No pleural effusion or pneumothorax. Regional skeleton is unremarkable. IMPRESSION: No acute  cardiopulmonary process. Electronically Signed   By: Annia Beltrew  Davis M.D.   On: 07/20/2018 08:32   Ct Angio Chest Pe W And/or Wo Contrast  Result Date: 07/20/2018 CLINICAL DATA:  42 year old female with history of left-sided chest pain since yesterday at noon, with pain radiating into the back. EXAM: CT ANGIOGRAPHY CHEST WITH CONTRAST TECHNIQUE: Multidetector CT imaging of the chest was performed using the standard protocol during bolus administration of intravenous contrast. Multiplanar CT image reconstructions and MIPs were obtained to evaluate the vascular anatomy. CONTRAST:  75mL ISOVUE-370 IOPAMIDOL (ISOVUE-370) INJECTION 76% COMPARISON:  None. FINDINGS: Cardiovascular: No filling defect within the pulmonary arterial tree to suggest underlying pulmonary embolism. Heart size is normal. There is no significant pericardial fluid, thickening or pericardial calcification. No atherosclerotic calcifications noted in the thoracic aorta or the coronary arteries. Mediastinum/Nodes: No pathologically  enlarged mediastinal or hilar lymph nodes. Esophagus is unremarkable in appearance. No axillary lymphadenopathy. Lungs/Pleura: 4 mm right lower lobe nodule (axial image 65 of series 6). No other larger more suspicious appearing pulmonary nodules or masses are noted. No acute consolidative airspace disease. No pleural effusions. Upper Abdomen: Unremarkable. Musculoskeletal: There are no aggressive appearing lytic or blastic lesions noted in the visualized portions of the skeleton. Review of the MIP images confirms the above findings. IMPRESSION: 1. No evidence of pulmonary embolism. No acute findings are noted in the thorax to account for the patient's symptoms. 2. 4 mm right lower lobe pulmonary nodule. This is nonspecific, but statistically likely benign. No follow-up needed if patient is low-risk. Non-contrast chest CT can be considered in 12 months if patient is high-risk. This recommendation follows the consensus  statement: Guidelines for Management of Incidental Pulmonary Nodules Detected on CT Images: From the Fleischner Society 2017; Radiology 2017; 284:228-243. Electronically Signed   By: Trudie Reed M.D.   On: 07/20/2018 11:19    ____________________________________________   PROCEDURES Procedures  ____________________________________________  DIFFERENTIAL DIAGNOSIS   Costochondritis, pleurisy, viral respiratory illness, pulmonary embolism.  Doubt ACS dissection AAA pneumothorax pericarditis.  Not septic.  CLINICAL IMPRESSION / ASSESSMENT AND PLAN / ED COURSE  Pertinent labs & imaging results that were available during my care of the patient were reviewed by me and considered in my medical decision making (see chart for details).    Patient presents with sharp pleuritic chest pain, severe and constant since yesterday.  EKG overall nonischemic and nondiagnostic.  Labs and chest x-ray unremarkable.  Despite the normal d-dimer, I think the patient's symptom pattern and questionable history of DVT necessitates a CT angiogram of the chest to rule out PE.  If negative I think that she can be treated for what clinically appears to be costochondritis.   ----------------------------------------- 12:05 PM on 07/20/2018 -----------------------------------------  CT negative.  Vital signs remain unremarkable except for hypertension.  Symptoms improved.  Will discharge home with prednisone, naproxen.     ____________________________________________   FINAL CLINICAL IMPRESSION(S) / ED DIAGNOSES    Final diagnoses:  Chest wall pain  Costochondritis, acute     ED Discharge Orders         Ordered    naproxen (NAPROSYN) 500 MG tablet  2 times daily with meals     07/20/18 1204    predniSONE (DELTASONE) 20 MG tablet  Daily     07/20/18 1204          Portions of this note were generated with dragon dictation software. Dictation errors may occur despite best attempts at  proofreading.   Sharman Cheek, MD 07/20/18 [phone]

## 2018-07-20 NOTE — ED Notes (Signed)
CT notified that pt had 20g above wrist for CTA

## 2018-07-20 NOTE — ED Notes (Signed)
Chest pain is worse with deep inspiration

## 2018-07-20 NOTE — ED Notes (Signed)
Patient transported to X-ray 

## 2018-07-20 NOTE — ED Triage Notes (Signed)
Pt to ED via POV c/o  Left sided chest pain that started yesterday around 12. Pt states that the pain radiated into her back. Pt states that it hurts when she takes a breath and pain increases when she moves, pt denies any other symptoms. Pt is in NAD at this time.

## 2019-06-09 ENCOUNTER — Ambulatory Visit (LOCAL_COMMUNITY_HEALTH_CENTER): Payer: Self-pay

## 2019-06-09 ENCOUNTER — Other Ambulatory Visit: Payer: Self-pay

## 2019-06-09 DIAGNOSIS — Z111 Encounter for screening for respiratory tuberculosis: Secondary | ICD-10-CM

## 2019-06-12 ENCOUNTER — Other Ambulatory Visit: Payer: Self-pay

## 2019-06-12 ENCOUNTER — Ambulatory Visit (LOCAL_COMMUNITY_HEALTH_CENTER): Payer: Medicaid Other

## 2019-06-12 DIAGNOSIS — Z111 Encounter for screening for respiratory tuberculosis: Secondary | ICD-10-CM

## 2019-06-12 LAB — TB SKIN TEST
Induration: 0 mm
TB Skin Test: NEGATIVE

## 2019-07-08 IMAGING — CR DG CHEST 2V
2 series · 2 of 2 positions shown · non-contrast
Comparison: Chest radiograph 05/10/2016

CLINICAL DATA: Left-sided chest pain.

EXAM:
CHEST - 2 VIEW

[chest pa]
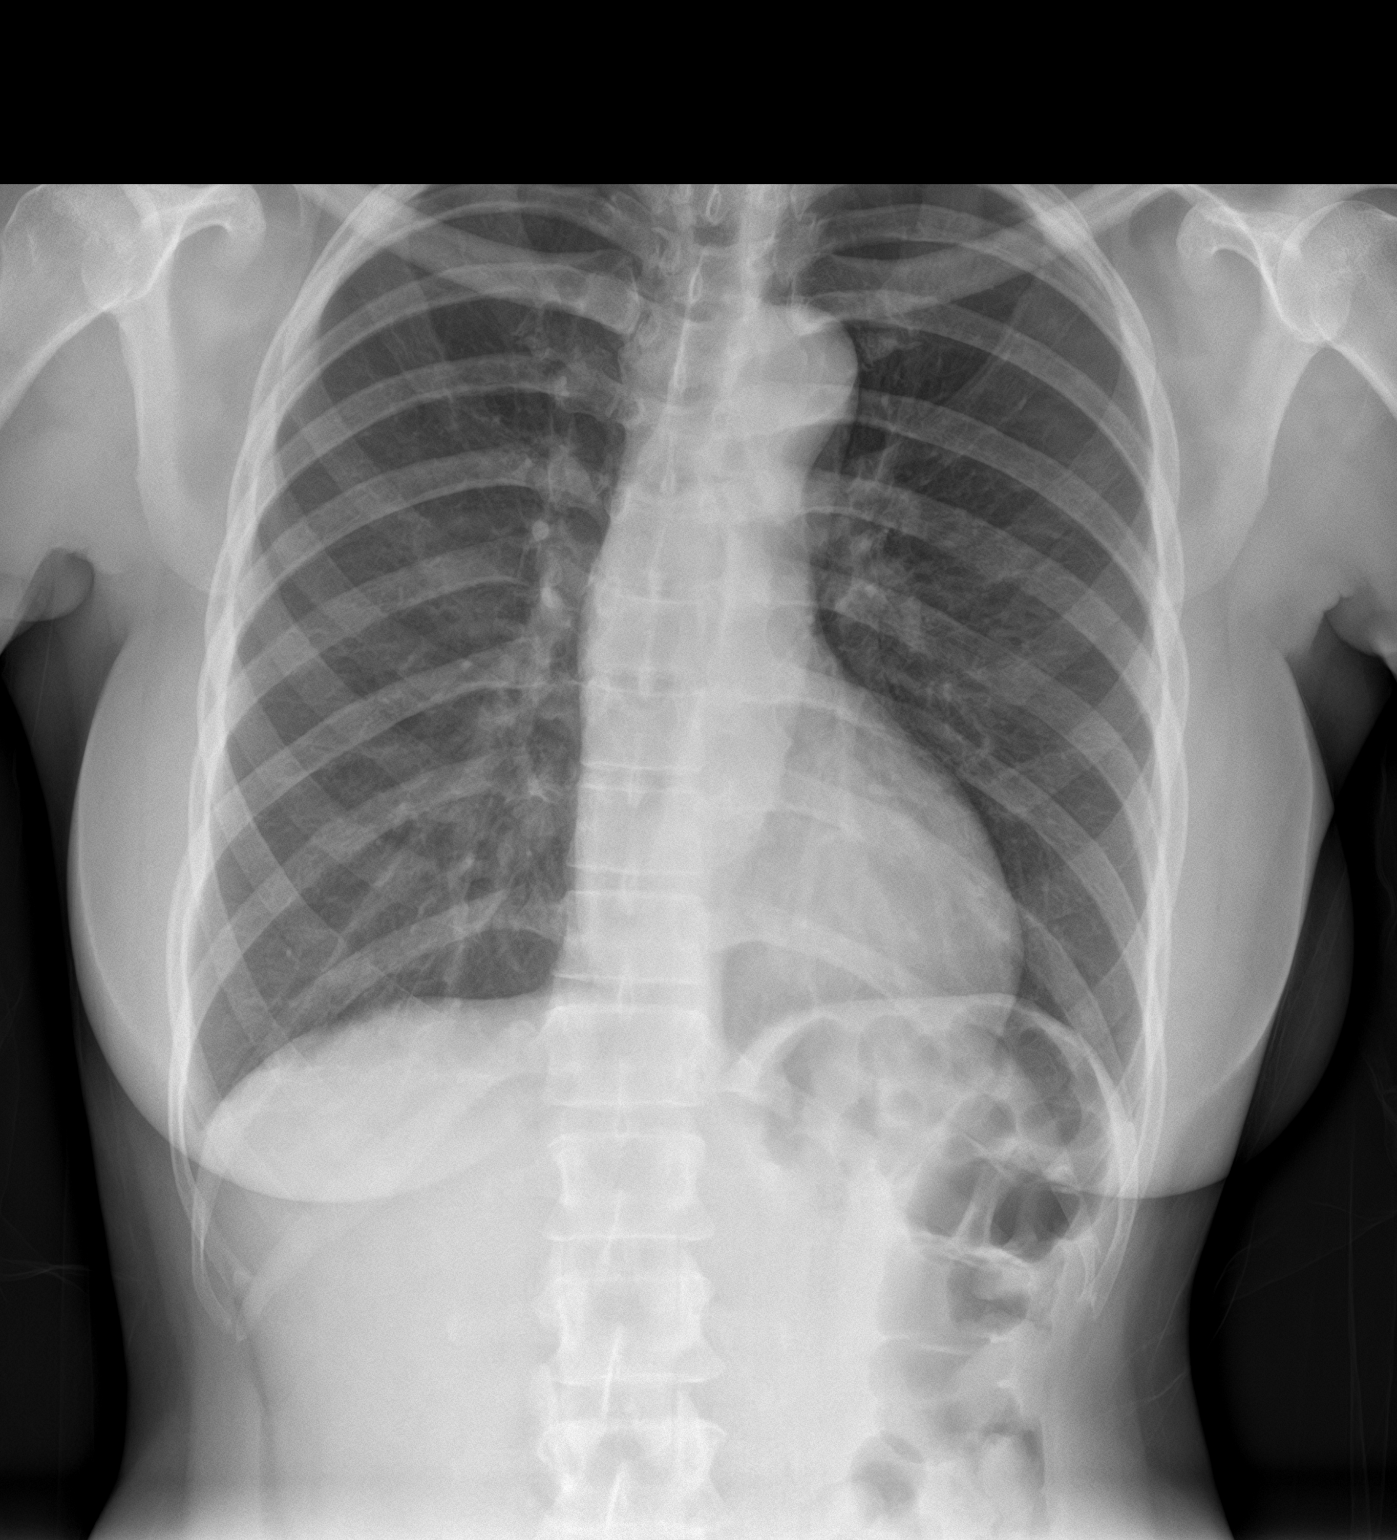

[chest lat]
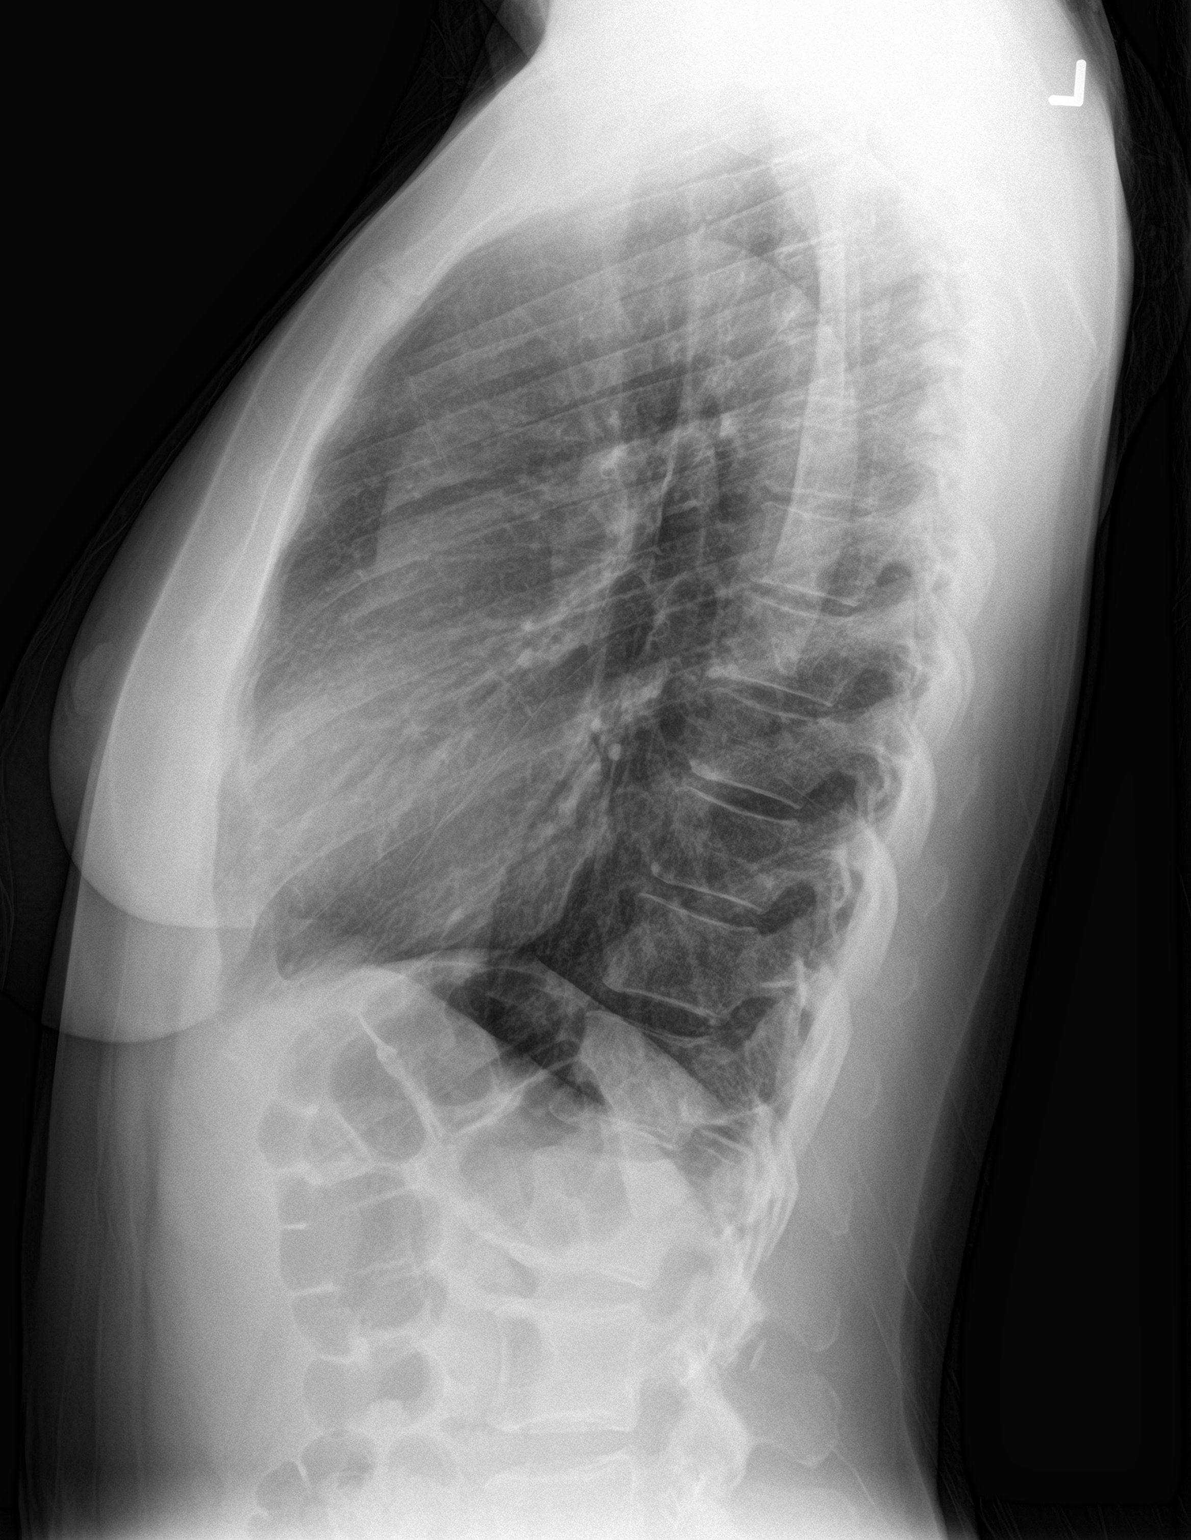

[2 of 2 positions shown; findings below may reference images not displayed]

FINDINGS: Normal cardiac and mediastinal contours. No consolidative pulmonary
opacities. No pleural effusion or pneumothorax. Regional skeleton is
unremarkable.
IMPRESSION: No acute cardiopulmonary process.

## 2019-07-08 IMAGING — CT CT ANGIO CHEST
2 of 7 series · 18 of 46 positions shown · IV contrast (APPLIED)
Comparison: None.

CLINICAL DATA: 42-year-old female with history of left-sided chest
pain since yesterday at noon, with pain radiating into the back.

EXAM:
CT ANGIOGRAPHY CHEST WITH CONTRAST
TECHNIQUE: Multidetector CT imaging of the chest was performed using the
standard protocol during bolus administration of intravenous
contrast. Multiplanar CT image reconstructions and MIPs were
obtained to evaluate the vascular anatomy.
CONTRAST:  75mL [id number] IOPAMIDOL ([id number]) INJECTION 76%

[Series 5: thins · axial · 0.57mm/px · z∈[+151,+407]mm · 15 of 288 slices shown]
[im 16/288  lung]
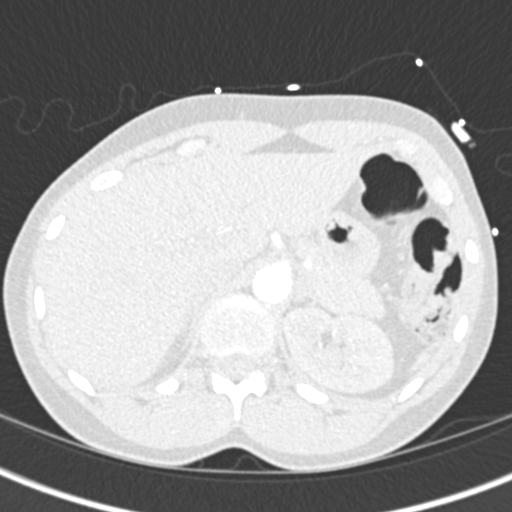
[im 31/288  soft-tissue]
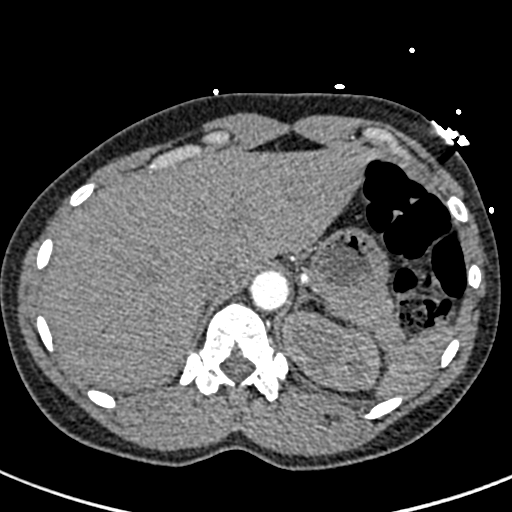
[im 61/288  lung]
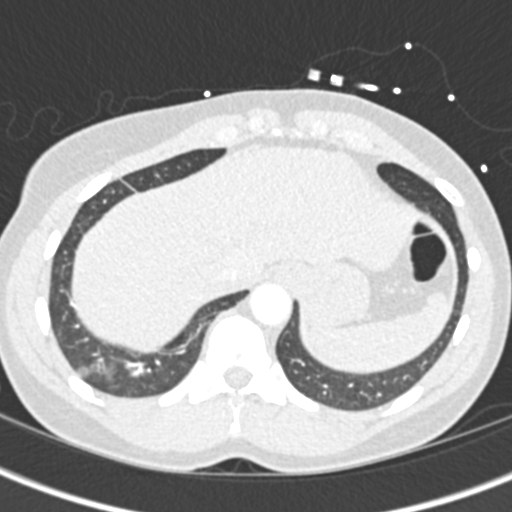
[im 76/288  soft-tissue]
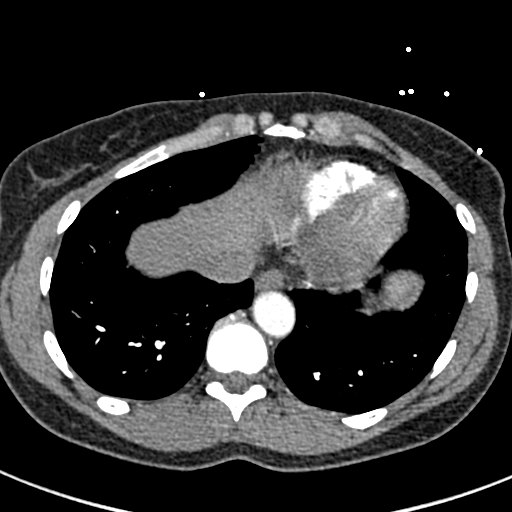
[im 91/288  lung]
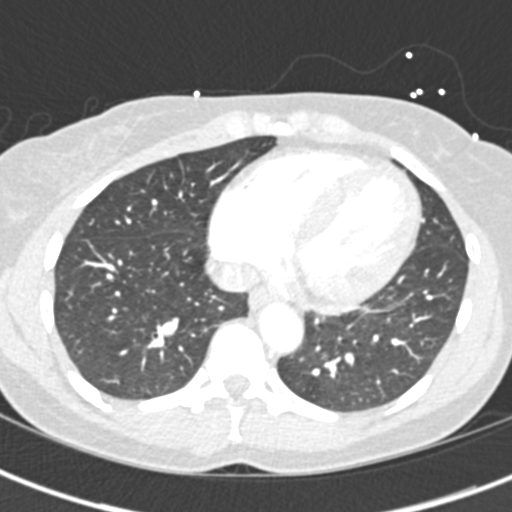
[im 106/288  soft-tissue]
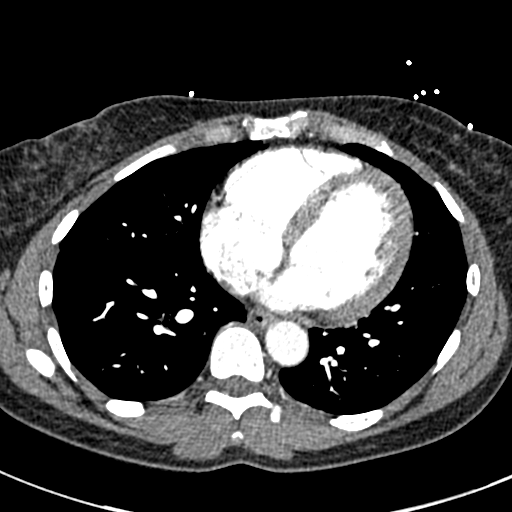
[im 121/288  lung]
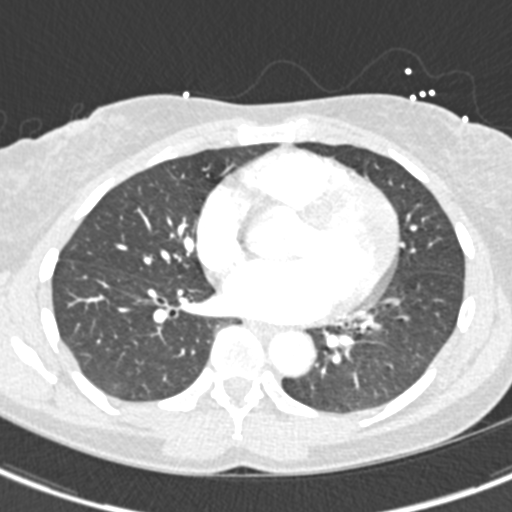
[im 152/288  soft-tissue]
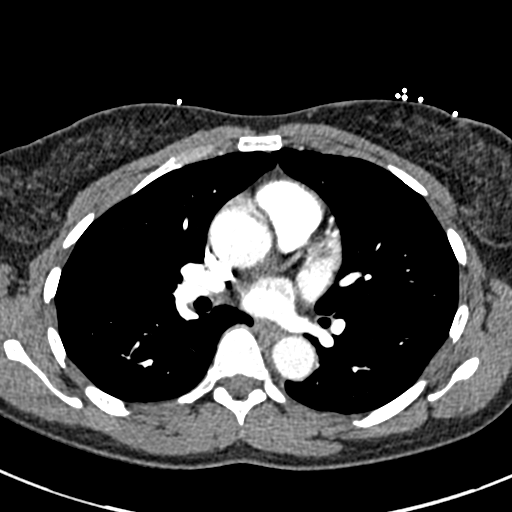
[im 167/288  lung]
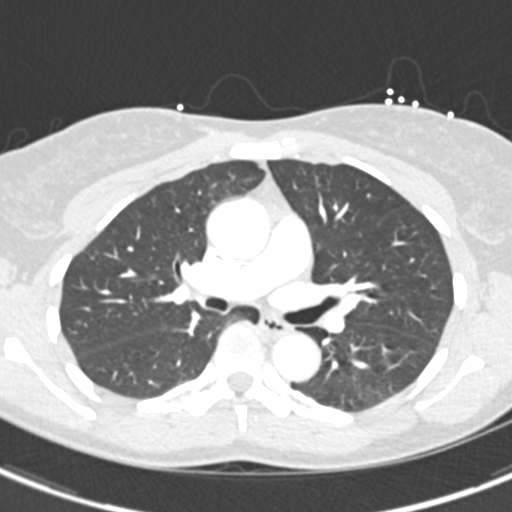
[im 182/288  soft-tissue]
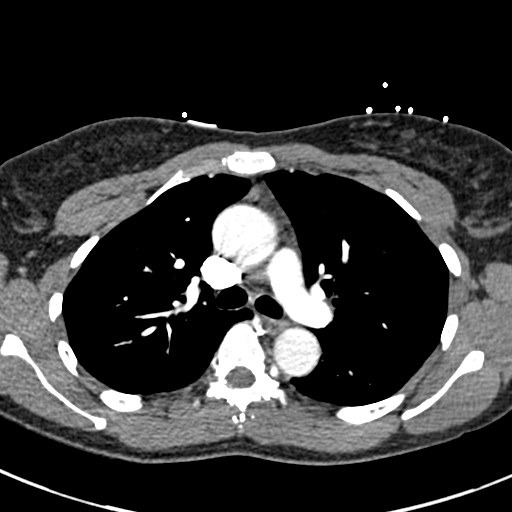
[im 197/288  lung]
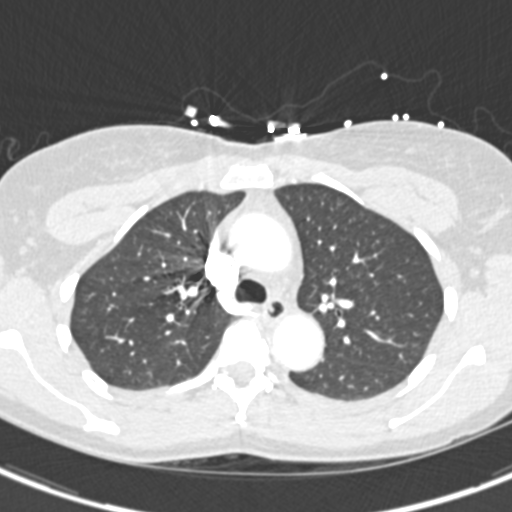
[im 212/288  soft-tissue]
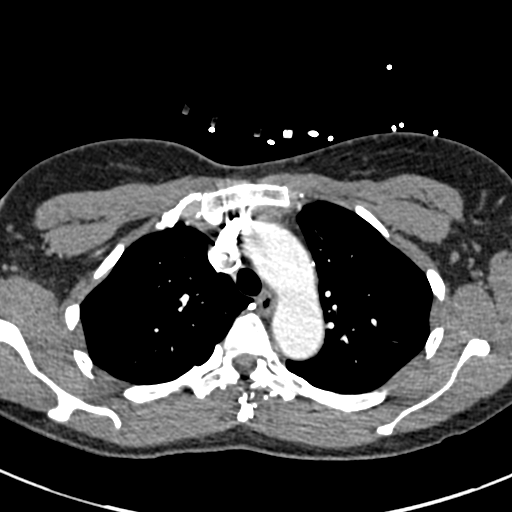
[im 242/288  lung]
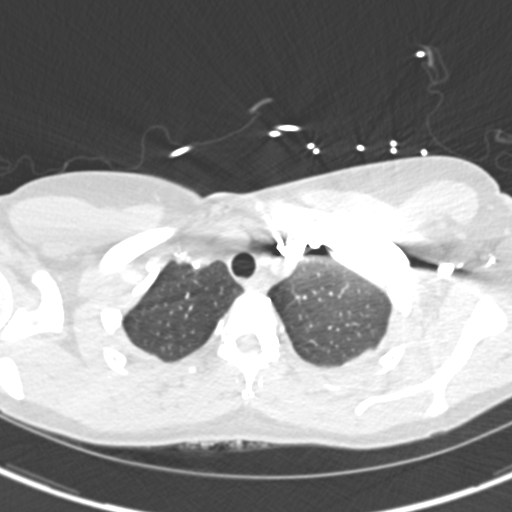
[im 257/288  soft-tissue]
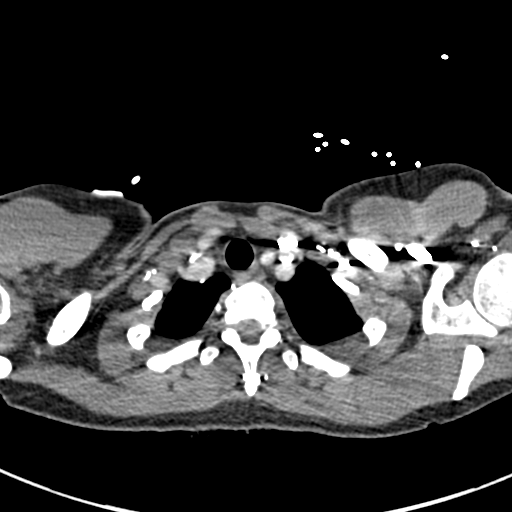
[im 272/288  lung]
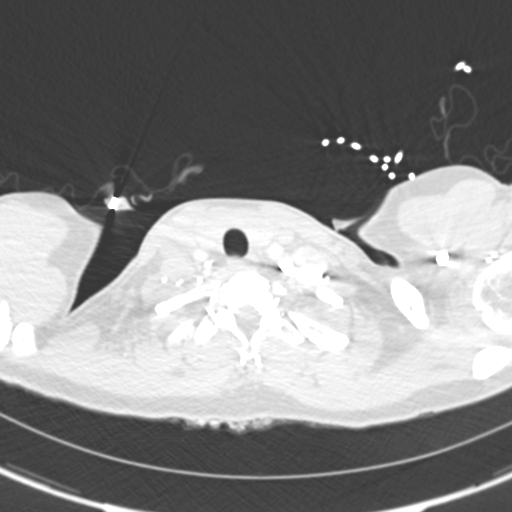

[Series 7: coronal mpr · coronal · 0.58mm/px · 3 of 66 slices shown]
[im 17/66  soft-tissue]
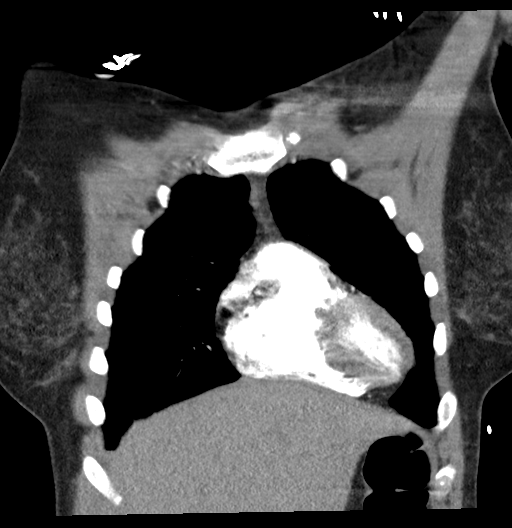
[im 33/66  soft-tissue]
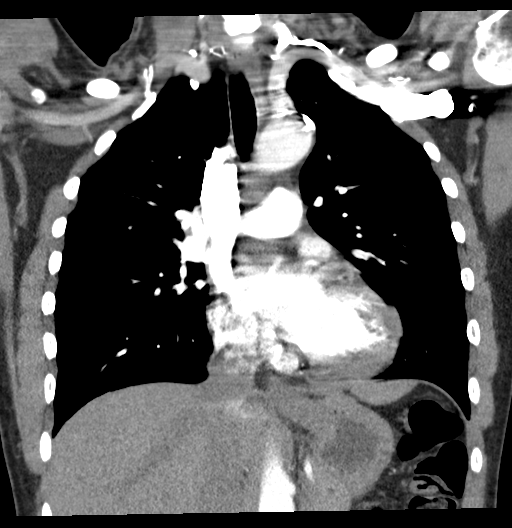
[im 49/66  soft-tissue]
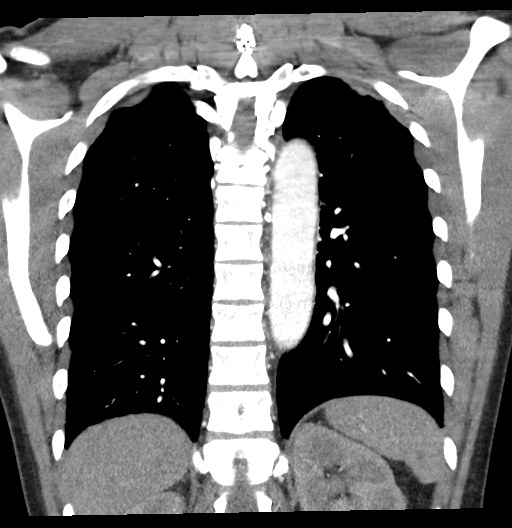

[18 of 46 positions shown; findings below may reference images not displayed]

FINDINGS: Cardiovascular: No filling defect within the pulmonary arterial tree
to suggest underlying pulmonary embolism. Heart size is normal.
There is no significant pericardial fluid, thickening or pericardial
calcification. No atherosclerotic calcifications noted in the
thoracic aorta or the coronary arteries.

Mediastinum/Nodes: No pathologically enlarged mediastinal or hilar
lymph nodes. Esophagus is unremarkable in appearance. No axillary
lymphadenopathy.

Lungs/Pleura: 4 mm right lower lobe nodule (axial image 65 of series
6). No other larger more suspicious appearing pulmonary nodules or
masses are noted. No acute consolidative airspace disease. No
pleural effusions.

Upper Abdomen: Unremarkable.

Musculoskeletal: There are no aggressive appearing lytic or blastic
lesions noted in the visualized portions of the skeleton.

Review of the MIP images confirms the above findings.
IMPRESSION: 1. No evidence of pulmonary embolism. No acute findings are noted in
the thorax to account for the patient's symptoms.
2. 4 mm right lower lobe pulmonary nodule. This is nonspecific, but
statistically likely benign. No follow-up needed if patient is
low-risk. Non-contrast chest CT can be considered in 12 months if
patient is high-risk. This recommendation follows the consensus
statement: Guidelines for Management of Incidental Pulmonary Nodules
Detected on CT Images: From the [HOSPITAL] 0997; Radiology

## 2021-06-13 ENCOUNTER — Other Ambulatory Visit: Payer: Medicaid Other

## 2021-06-20 ENCOUNTER — Other Ambulatory Visit: Payer: Medicaid Other

## 2022-09-15 ENCOUNTER — Ambulatory Visit (LOCAL_COMMUNITY_HEALTH_CENTER): Payer: Medicaid Other

## 2022-09-15 ENCOUNTER — Other Ambulatory Visit: Payer: Medicaid Other

## 2022-09-15 DIAGNOSIS — Z111 Encounter for screening for respiratory tuberculosis: Secondary | ICD-10-CM

## 2022-09-18 ENCOUNTER — Ambulatory Visit (LOCAL_COMMUNITY_HEALTH_CENTER): Payer: Medicaid Other

## 2022-09-18 DIAGNOSIS — Z111 Encounter for screening for respiratory tuberculosis: Secondary | ICD-10-CM

## 2022-09-18 LAB — TB SKIN TEST
Induration: 0 mm
TB Skin Test: NEGATIVE

## 2023-07-16 ENCOUNTER — Encounter (HOSPITAL_COMMUNITY): Payer: Self-pay

## 2023-07-16 ENCOUNTER — Emergency Department (HOSPITAL_COMMUNITY): Payer: Self-pay

## 2023-07-16 ENCOUNTER — Other Ambulatory Visit: Payer: Self-pay

## 2023-07-16 ENCOUNTER — Emergency Department (HOSPITAL_COMMUNITY)
Admission: EM | Admit: 2023-07-16 | Discharge: 2023-07-17 | Disposition: A | Discharge status: Home / Self Care | Payer: Self-pay | Attending: Emergency Medicine | Admitting: Emergency Medicine

## 2023-07-16 DIAGNOSIS — R42 Dizziness and giddiness: Secondary | ICD-10-CM

## 2023-07-16 DIAGNOSIS — M545 Low back pain, unspecified: Secondary | ICD-10-CM | POA: Insufficient documentation

## 2023-07-16 DIAGNOSIS — R1084 Generalized abdominal pain: Secondary | ICD-10-CM | POA: Insufficient documentation

## 2023-07-16 DIAGNOSIS — R030 Elevated blood-pressure reading, without diagnosis of hypertension: Secondary | ICD-10-CM

## 2023-07-16 DIAGNOSIS — Z79899 Other long term (current) drug therapy: Secondary | ICD-10-CM | POA: Insufficient documentation

## 2023-07-16 DIAGNOSIS — I10 Essential (primary) hypertension: Secondary | ICD-10-CM | POA: Insufficient documentation

## 2023-07-16 LAB — CBC
HCT: 40.8 % (ref 36.0–46.0)
Hemoglobin: 13.5 g/dL (ref 12.0–15.0)
MCH: 29.2 pg (ref 26.0–34.0)
MCHC: 33.1 g/dL (ref 30.0–36.0)
MCV: 88.3 fL (ref 80.0–100.0)
Platelets: 282 10*3/uL (ref 150–400)
RBC: 4.62 MIL/uL (ref 3.87–5.11)
RDW: 14.3 % (ref 11.5–15.5)
WBC: 8.5 10*3/uL (ref 4.0–10.5)
nRBC: 0 % (ref 0.0–0.2)

## 2023-07-16 LAB — RAPID URINE DRUG SCREEN, HOSP PERFORMED
Amphetamines: NOT DETECTED
Barbiturates: NOT DETECTED
Benzodiazepines: NOT DETECTED
Cocaine: NOT DETECTED
Opiates: NOT DETECTED
Tetrahydrocannabinol: POSITIVE — AB

## 2023-07-16 LAB — URINALYSIS, ROUTINE W REFLEX MICROSCOPIC
Bacteria, UA: NONE SEEN
Bilirubin Urine: NEGATIVE
Glucose, UA: NEGATIVE mg/dL
Ketones, ur: NEGATIVE mg/dL
Leukocytes,Ua: NEGATIVE
Nitrite: NEGATIVE
Protein, ur: NEGATIVE mg/dL
Specific Gravity, Urine: 1.001 — ABNORMAL LOW (ref 1.005–1.030)
pH: 8 (ref 5.0–8.0)

## 2023-07-16 NOTE — ED Triage Notes (Addendum)
Pt arrived from home via POV w/ multiple complaints. Pt is very hypertensive. Abd pain all over. Zero Bm x 7 days. Pt is dizzy. N/V.

## 2023-07-17 ENCOUNTER — Emergency Department (HOSPITAL_COMMUNITY): Payer: Self-pay

## 2023-07-17 LAB — COMPREHENSIVE METABOLIC PANEL
ALT: 11 U/L (ref 0–44)
AST: 20 U/L (ref 15–41)
Albumin: 4 g/dL (ref 3.5–5.0)
Alkaline Phosphatase: 47 U/L (ref 38–126)
Anion gap: 9 (ref 5–15)
BUN: 5 mg/dL — ABNORMAL LOW (ref 6–20)
CO2: 21 mmol/L — ABNORMAL LOW (ref 22–32)
Calcium: 9.1 mg/dL (ref 8.9–10.3)
Chloride: 110 mmol/L (ref 98–111)
Creatinine, Ser: 0.87 mg/dL (ref 0.44–1.00)
GFR, Estimated: >60 mL/min (ref >60)
Glucose, Bld: 93 mg/dL (ref 70–99)
Potassium: 3.2 mmol/L — ABNORMAL LOW (ref 3.5–5.1)
Sodium: 140 mmol/L (ref 135–145)
Total Bilirubin: 0.3 mg/dL (ref <1.2)
Total Protein: 7.3 g/dL (ref 6.5–8.1)

## 2023-07-17 LAB — HCG, SERUM, QUALITATIVE: Preg, Serum: NEGATIVE

## 2023-07-17 LAB — TROPONIN I (HIGH SENSITIVITY)
Troponin I (High Sensitivity): 6 ng/L (ref <18)
Troponin I (High Sensitivity): 6 ng/L (ref <18)

## 2023-07-17 LAB — LIPASE, BLOOD: Lipase: 40 U/L (ref 11–51)

## 2023-07-17 MED ORDER — AMLODIPINE BESY-BENAZEPRIL HCL 5-10 MG PO CAPS: 1.0000 | ORAL_CAPSULE | Freq: Every day | ORAL | 0 refills | Status: AC

## 2023-07-17 MED ORDER — ONDANSETRON HCL 4 MG/2ML IJ SOLN
4.0000 mg | Freq: Once | INTRAMUSCULAR | Status: AC
  Administered 2023-07-17: 4 mg via INTRAVENOUS
  Filled 2023-07-17: qty 2

## 2023-07-17 MED ORDER — KETOROLAC TROMETHAMINE 15 MG/ML IJ SOLN
15.0000 mg | Freq: Once | INTRAMUSCULAR | Status: AC
  Administered 2023-07-17: 15 mg via INTRAVENOUS
  Filled 2023-07-17: qty 1

## 2023-07-17 MED ORDER — IOHEXOL 350 MG/ML SOLN
75.0000 mL | Freq: Once | INTRAVENOUS | Status: AC | PRN
  Administered 2023-07-17: 75 mL via INTRAVENOUS

## 2023-07-17 MED ORDER — HYDROCHLOROTHIAZIDE 25 MG PO TABS: 25.0000 mg | ORAL_TABLET | Freq: Every day | ORAL | 0 refills | Status: AC

## 2023-07-17 MED ORDER — AMLODIPINE BESY-BENAZEPRIL HCL 5-10 MG PO CAPS: 1.0000 | ORAL_CAPSULE | Freq: Every day | ORAL | 0 refills | Status: DC

## 2023-07-17 MED ORDER — HYDRALAZINE HCL 20 MG/ML IJ SOLN
5.0000 mg | Freq: Once | INTRAMUSCULAR | Status: AC
  Administered 2023-07-17: 5 mg via INTRAVENOUS
  Filled 2023-07-17: qty 1

## 2023-07-17 MED ORDER — MECLIZINE HCL 25 MG PO TABS
25.0000 mg | ORAL_TABLET | Freq: Once | ORAL | Status: AC
  Administered 2023-07-17: 25 mg via ORAL
  Filled 2023-07-17: qty 1

## 2023-07-17 MED ORDER — HYDROCHLOROTHIAZIDE 25 MG PO TABS: 25.0000 mg | ORAL_TABLET | Freq: Every day | ORAL | 0 refills | Status: DC

## 2023-07-17 NOTE — ED Provider Notes (Signed)
EMERGENCY DEPARTMENT AT [facility] Provider Note   CSN: [id number] Arrival date & time: 07/16/23  2216     History  Chief Complaint  Patient presents with   Abdominal Pain   Hypertension    Penny Shelton is a 47 y.o. female.  Patient with a history of hypertension not taking medications for several months here with abdominal pain, nausea and constipation.  States over the past 2 to 3 days she has had lower abdominal cramping and low back pain.  She has had intermittent back pain for the past 20 years ever since her epidural and normally gets back pain at the time of her cycle but never gets lower crampy abdominal pain like this.  She describes the pain as a "contraction".  Pain comes and goes and waxes and wanes in severity.  No associated vaginal bleeding or discharge.  No pain with urination or blood in the urine.  No chest pain or shortness of breath.  She been urinating frequently but is not painful and there is no blood in it.  Also complains of a dizzy spell that happened today while she was home around 9 PM.  Felt suddenly the room was spinning and she had nausea but no vomiting.  Symptoms lasted several hours and after starting to improve.  No focal weakness, numbness or tingling.  No blurry vision or double vision.  No room spinning dizziness currently.  Does have a dull headache and nausea but no vomiting.  Does not know when her last bowel movement was.  Believes it was sometime last week.  Cannot state the last time she took her blood pressure medications.  The history is provided by the patient.  Abdominal Pain Associated symptoms: nausea   Associated symptoms: no cough, no dysuria, no fatigue, no fever, no hematuria, no shortness of breath and no vomiting   Hypertension Associated symptoms include abdominal pain and headaches. Pertinent negatives include no shortness of breath.       Home Medications Prior to Admission medications   Medication  Sig Start Date End Date Taking? Authorizing Provider  amLODipine (NORVASC) 5 MG tablet Take 1 tablet (5 mg total) by mouth daily. 07/20/18   Sharman Cheek, MD  amLODipine-benazepril (LOTREL) 5-10 MG capsule Take 1 capsule by mouth daily. 05/10/16   Joni Reining, PA-C  hydrochlorothiazide (HYDRODIURIL) 25 MG tablet Take 1 tablet (25 mg total) by mouth daily. 05/10/16   Joni Reining, PA-C  LORazepam (ATIVAN) 2 MG tablet Take 2 mg by mouth at bedtime.    [provider]  naproxen (NAPROSYN) 500 MG tablet Take 1 tablet (500 mg total) by mouth 2 (two) times daily with a meal. 07/20/18   Sharman Cheek, MD  potassium chloride (K-DUR) 10 MEQ tablet Take 1 tablet (10 mEq total) by mouth as directed. Take on Monday, Wednesday, and Friday. 05/10/16   Joni Reining, PA-C  predniSONE (DELTASONE) 20 MG tablet Take 2 tablets (40 mg total) by mouth daily. 07/20/18   Sharman Cheek, MD      Allergies    Patient has no known allergies.    Review of Systems   Review of Systems  Constitutional:  Negative for activity change, appetite change, fatigue and fever.  HENT:  Negative for congestion and rhinorrhea.   Eyes:  Negative for visual disturbance.  Respiratory:  Negative for cough, chest tightness and shortness of breath.   Gastrointestinal:  Positive for abdominal pain and nausea. Negative for vomiting.  Genitourinary:  Positive for frequency. Negative for dysuria, flank pain and hematuria.  Musculoskeletal:  Positive for back pain.  Skin:  Negative for rash.  Neurological:  Positive for dizziness, weakness, light-headedness and headaches.   all other systems are negative except as noted in the HPI and PMH.    Physical Exam Updated Vital Signs BP (!) 183/133 (BP Location: Right Arm)   Pulse 80   Temp 98.5 F (36.9 C)   Resp 15   Ht 5\' 5"  (1.651 m)   Wt 72.6 kg   LMP 05/28/2023 (Approximate)   SpO2 100%   BMI 26.63 kg/m  Physical Exam Vitals and nursing note  reviewed.  Constitutional:      General: She is not in acute distress.    Appearance: She is well-developed. She is not ill-appearing.     Comments: Smells of marijuana  HENT:     Head: Normocephalic and atraumatic.     Mouth/Throat:     Pharynx: No oropharyngeal exudate.  Eyes:     Conjunctiva/sclera: Conjunctivae normal.     Pupils: Pupils are equal, round, and reactive to light.  Neck:     Comments: No meningismus. Cardiovascular:     Rate and Rhythm: Normal rate and regular rhythm.     Heart sounds: Normal heart sounds. No murmur heard. Pulmonary:     Effort: Pulmonary effort is normal. No respiratory distress.     Breath sounds: Normal breath sounds.  Abdominal:     Palpations: Abdomen is soft.     Tenderness: There is abdominal tenderness. There is no guarding or rebound.     Comments: Diffuse lower abdominal tenderness  Musculoskeletal:        General: No tenderness. Normal range of motion.     Cervical back: Normal range of motion and neck supple.  Skin:    General: Skin is warm.  Neurological:     Mental Status: She is alert and oriented to person, place, and time.     Cranial Nerves: No cranial nerve deficit.     Motor: No abnormal muscle tone.     Coordination: Coordination normal.     Comments: CN 2-12 intact, no ataxia on finger to nose, no nystagmus, 5/5 strength throughout, no pronator drift, Romberg negative, normal gait.   No Nystagmus.  No ataxia on finger-to-nose.  Negative Romberg, normal gait.  Psychiatric:        Behavior: Behavior normal.     ED Results / Procedures / Treatments   Labs (all labs ordered are listed, but only abnormal results are displayed) Labs Reviewed  COMPREHENSIVE METABOLIC PANEL - Abnormal; Notable for the following components:      Result Value   Potassium 3.2 (*)    CO2 21 (*)    BUN 5 (*)    All other components within normal limits  URINALYSIS, ROUTINE W REFLEX MICROSCOPIC - Abnormal; Notable for the following  components:   Color, Urine COLORLESS (*)    Specific Gravity, Urine 1.001 (*)    Hgb urine dipstick SMALL (*)    All other components within normal limits  RAPID URINE DRUG SCREEN, HOSP PERFORMED - Abnormal; Notable for the following components:   Tetrahydrocannabinol POSITIVE (*)    All other components within normal limits  LIPASE, BLOOD  CBC  HCG, SERUM, QUALITATIVE  TROPONIN I (HIGH SENSITIVITY)  TROPONIN I (HIGH SENSITIVITY)    EKG EKG Interpretation Date/Time:  Monday July 16 2023 22:04:31 EST Ventricular Rate:  96 PR Interval:  156 QRS Duration:  78 QT Interval:  334 QTC Calculation: 421 R Axis:   22  Text Interpretation: Normal sinus rhythm Nonspecific T wave abnormality Abnormal ECG When compared with ECG of 20-Jul-2018 07:27, PREVIOUS ECG IS PRESENT No significant change was found Confirmed by Glynn Octave [phone]) on 07/17/2023 12:10:52 AM  Radiology MR LUMBAR SPINE WO CONTRAST Result Date: 07/17/2023 CLINICAL DATA:  47 year old female with worsening persistent low back pain, urinary incontinence, frequent urination. EXAM: MRI LUMBAR SPINE WITHOUT CONTRAST TECHNIQUE: Multiplanar, multisequence MR imaging of the lumbar spine was performed. No intravenous contrast was administered. COMPARISON:  CTA Chest, Abdomen, and Pelvis today reported separately. FINDINGS: Segmentation:  Normal on the comparison CTA this morning. Alignment: Relatively normal lumbar lordosis. No significant scoliosis or spondylolisthesis. Vertebrae: Visualized bone marrow signal is within normal limits. No marrow edema or evidence of acute osseous abnormality. Maintained vertebral body height. Intact visible sacrum and SI joints. Conus medullaris and cauda equina: Conus extends to the T12-L1 level. No lower spinal cord or conus signal abnormality. Unremarkable cauda quinine nerve roots and capacious lumbar spinal canal at most levels. Paraspinal and other soft tissues: Heterogeneous uterine  myometrium partially visible in the presacral space (series 6, image 9). No discrete visible myometrial mass. Visualized abdominal viscera and paraspinal soft tissues are within normal limits. Disc levels: T12-L1:  Negative. L1-L2:  Negative disc.  Mild facet hypertrophy.  No stenosis. L2-L3: Negative disc. Mild facet and ligament flavum hypertrophy. Mild mass effect on the dorsal thecal sac (series 8, image 18) but no significant spinal, lateral recess, foraminal stenosis. L3-L4: Disc desiccation with mild fairly symmetric circumferential disc bulging. Mild facet and ligament flavum hypertrophy. No significant spinal or lateral recess stenosis. Borderline to mild left L3 neural foraminal stenosis. L4-L5: Subtle disc desiccation. Mild mostly foraminal disc bulging. Mild ligament flavum hypertrophy. No convincing spinal, lateral recess, foraminal stenosis. L5-S1:  Negative disc.  Mild facet hypertrophy.  No stenosis. IMPRESSION: 1. Mild lumbar spine degeneration with no significant spinal or lateral recess stenosis. Mild left L3 neural foraminal stenosis. 2. Partially visible heterogeneous uterine myometrium, perhaps Uterine Adenomyosis. Non emergent GYN protocol pelvis MRI would best characterize further. Electronically Signed   By: Odessa Fleming M.D.   On: 07/17/2023 05:19   MR BRAIN WO CONTRAST Result Date: 07/17/2023 CLINICAL DATA:  Ataxia, dizziness, stroke suspected EXAM: MRI HEAD WITHOUT CONTRAST TECHNIQUE: Multiplanar, multiecho pulse sequences of the brain and surrounding structures were obtained without intravenous contrast. COMPARISON:  No prior MRI available, correlation is made with CTA head and neck 07/17/2023 FINDINGS: Brain: No restricted diffusion to suggest acute or subacute infarct. No acute hemorrhage, mass, mass effect, or midline shift. No hydrocephalus or extra-axial collection. Pituitary and craniocervical junction within normal limits. No hemosiderin deposition to suggest remote hemorrhage.  Normal cerebral volume. Vascular: Normal arterial flow voids. Skull and upper cervical spine: Normal marrow signal. Sinuses/Orbits: Clear paranasal sinuses. No acute finding in the orbits. Other: The mastoid air cells are well aerated. IMPRESSION: No acute intracranial process. No evidence of acute or subacute infarct. Electronically Signed   By: Wiliam Ke M.D.   On: 07/17/2023 03:50   CT Angio Chest/Abd/Pel for Dissection W and/or Wo Contrast Result Date: 07/17/2023 CLINICAL DATA:  Hypertension, abdominal pain, nausea, vomiting EXAM: CT ANGIOGRAPHY CHEST, ABDOMEN AND PELVIS TECHNIQUE: Non-contrast CT of the chest was initially obtained. Multidetector CT imaging through the chest, abdomen and pelvis was performed using the standard protocol during bolus administration of intravenous contrast. Multiplanar reconstructed images  and MIPs were obtained and reviewed to evaluate the vascular anatomy. RADIATION DOSE REDUCTION: This exam was performed according to the departmental dose-optimization program which includes automated exposure control, adjustment of the mA and/or kV according to patient size and/or use of iterative reconstruction technique. CONTRAST:  75mL OMNIPAQUE IOHEXOL 350 MG/ML SOLN COMPARISON:  07/20/2018 FINDINGS: CTA CHEST FINDINGS Cardiovascular: Heart is normal size. Aorta is normal caliber. No aortic dissection. No large or central pulmonary embolus. Mediastinum/Nodes: No mediastinal, hilar, or axillary adenopathy. Trachea and esophagus are unremarkable. Thyroid unremarkable. Lungs/Pleura: Lungs are clear. No focal airspace opacities or suspicious nodules. No effusions. Musculoskeletal: Chest wall soft tissues are unremarkable. No acute bony abnormality. Review of the MIP images confirms the above findings. CTA ABDOMEN AND PELVIS FINDINGS VASCULAR Aorta: Normal caliber aorta without aneurysm, dissection, vasculitis or significant stenosis. Celiac: Patent without evidence of aneurysm,  dissection, vasculitis or significant stenosis. SMA: Patent without evidence of aneurysm, dissection, vasculitis or significant stenosis. Renals: Both renal arteries are patent without evidence of aneurysm, dissection, vasculitis, fibromuscular dysplasia or significant stenosis. IMA: Patent without evidence of aneurysm, dissection, vasculitis or significant stenosis. Inflow: Patent without evidence of aneurysm, dissection, vasculitis or significant stenosis. Mild atherosclerotic disease in the common iliac arteries. Veins: No obvious venous abnormality within the limitations of this arterial phase study. Review of the MIP images confirms the above findings. NON-VASCULAR Hepatobiliary: No focal hepatic abnormality. Gallbladder unremarkable. Pancreas: No focal abnormality or ductal dilatation. Spleen: No focal abnormality.  Normal size. Adrenals/Urinary Tract: No adrenal abnormality. No focal renal abnormality. No stones or hydronephrosis. Urinary bladder is unremarkable. Stomach/Bowel: Normal appendix. Stomach, large and small bowel grossly unremarkable. Lymphatic: No adenopathy Reproductive: Uterus and adnexa unremarkable.  No mass. Other: Small amount of free fluid in the cul-de-sac.  No free air. Musculoskeletal: No acute bony abnormality. Review of the MIP images confirms the above findings. IMPRESSION: No evidence of aortic aneurysm or dissection. No acute findings in the chest, abdomen or pelvis. Electronically Signed   By: Charlett Nose M.D.   On: 07/17/2023 02:04   CT ANGIO HEAD NECK W WO CM Result Date: 07/17/2023 CLINICAL DATA:  Hypertension, dizziness, nausea and vomiting EXAM: CT ANGIOGRAPHY HEAD AND NECK WITH AND WITHOUT CONTRAST TECHNIQUE: Multidetector CT imaging of the head and neck was performed using the standard protocol during bolus administration of intravenous contrast. Multiplanar CT image reconstructions and MIPs were obtained to evaluate the vascular anatomy. Carotid stenosis measurements  (when applicable) are obtained utilizing NASCET criteria, using the distal internal carotid diameter as the denominator. RADIATION DOSE REDUCTION: This exam was performed according to the departmental dose-optimization program which includes automated exposure control, adjustment of the mA and/or kV according to patient size and/or use of iterative reconstruction technique. CONTRAST:  75mL OMNIPAQUE IOHEXOL 350 MG/ML SOLN COMPARISON:  No prior CTA available, correlation is made with 05/10/2016 CT head FINDINGS: CT HEAD FINDINGS Brain: No evidence of acute infarct, hemorrhage, mass, mass effect, or midline shift. No hydrocephalus or extra-axial fluid collection. Normal pituitary and craniocervical junction. Vascular: No hyperdense vessel. Skull: Negative for fracture or focal lesion. Sinuses/Orbits: Mucosal thickening in the right sphenoid sinus. No acute finding in the orbits. Other: The mastoid air cells are well aerated. CTA NECK FINDINGS Aortic arch: Standard branching. Imaged portion shows no evidence of aneurysm or dissection. No significant stenosis of the major arch vessel origins. Right carotid system: No evidence of dissection, occlusion, or hemodynamically significant stenosis (greater than 50%). Left carotid system: No evidence of dissection, occlusion,  or hemodynamically significant stenosis (greater than 50%). Vertebral arteries: Moderate stenosis in the right V1 segment, just past the origin (series 9, image 270). The right vertebral artery is otherwise patent to the skull base. The left vertebral artery is patent the skull base without significant stenosis. No evidence of dissection. Skeleton: No acute osseous abnormality. Degenerative changes in the cervical spine. Other neck: No acute finding. Upper chest: No focal pulmonary opacity or pleural effusion. Review of the MIP images confirms the above findings CTA HEAD FINDINGS Anterior circulation: Both internal carotid arteries are patent to the  termini, without significant stenosis. A1 segments patent. Normal anterior communicating artery. Anterior cerebral arteries are patent to their distal aspects without significant stenosis. Moderate stenosis in the proximal left M1 (series 9, image 106). No right M1 stenosis. MCA branches perfused to their distal aspects without significant stenosis. Posterior circulation: Vertebral arteries patent to the vertebrobasilar junction without significant stenosis. Posterior inferior cerebellar arteries patent proximally. Basilar patent to its distal aspect without significant stenosis. Superior cerebellar arteries patent proximally. Patent P1 segments. Mild stenosis in the proximal right P2 (series 9, image 107) and distal left P 2 (series 9, image 101). The bilateral posterior communicating arteries are not visualized. Venous sinuses: As permitted by contrast timing, patent. Anatomic variants: None significant. No evidence of aneurysm or vascular malformation. Review of the MIP images confirms the above findings IMPRESSION: 1. No acute intracranial process. No etiology is seen for the patient's symptoms. 2. No intracranial large vessel occlusion. Moderate stenosis in the proximal left M1. Mild stenosis in the proximal right P2 and distal left P2. 3. Moderate stenosis in the right V1 segment. No other hemodynamically significant stenosis in the neck. Electronically Signed   By: Wiliam Ke M.D.   On: 07/17/2023 01:35   DG Chest 2 View Result Date: 07/17/2023 CLINICAL DATA:  Chest pain, hypertension EXAM: CHEST - 2 VIEW COMPARISON:  07/20/2018 FINDINGS: The heart size and mediastinal contours are within normal limits. Both lungs are clear. The visualized skeletal structures are unremarkable. IMPRESSION: No active cardiopulmonary disease. Electronically Signed   By: Charlett Nose M.D.   On: 07/17/2023 01:07    Procedures Procedures    Medications Ordered in ED Medications  ondansetron (ZOFRAN) injection 4 mg  (has no administration in time range)  meclizine (ANTIVERT) tablet 25 mg (has no administration in time range)  hydrALAZINE (APRESOLINE) injection 5 mg (has no administration in time range)    ED Course/ Medical Decision Making/ A&P                                 Medical Decision Making Amount and/or Complexity of Data Reviewed Labs: ordered. Decision-making details documented in ED Course. Radiology: ordered and independent interpretation performed. Decision-making details documented in ED Course. ECG/medicine tests: ordered and independent interpretation performed. Decision-making details documented in ED Course.  Risk Prescription drug management.   Lower abdominal pain, low back pain, dizziness and hypertension.  No bowel movement for 7 days.  Hypertensive on arrival not had her meds for several weeks.  Her neurological exam is nonfocal.  EKG shows no acute ischemia.  Low suspicion for ACS.  Low suspicion for CVA or TIA.  Labs obtained in triage show negative urinalysis, normal LFTs and lipase.  No significant leukocytosis No significant urinary retention.  Considered kidney stone causing her abdominal pain, back pain and urinary frequency.  CT scan will be obtained given  patient's severe abdominal pain and back pain.  Her neurological exam is nonfocal with low suspicion for central cause of her dizziness.  However given her degree of hypertension will obtain head imaging to rule out intracranial hemorrhage.  Labs show no evidence of endorgan damage.  Creatinine normal.  Troponin negative x 2.  EKG is nonischemic.  CT head is negative for hemorrhage.  CT abdomen pelvis with no acute findings.  No evidence of aortic aneurysm and  aortic dissection. Abdominal pain likely secondary to constipation and possible uterine adenomyosis. No evidence of bowel obstruction.  Patient now states she was at home she was lying on the floor with severe back pain and could not control her bladder  and lost control and urinated on herself.  She is urinating frequently ever since small amounts.  No evidence of urinary retention on postvoid residual check.  Urinalysis is negative for infection or hematuria.  Lumbar spine MRI was obtained to evaluate for cord compression or cauda equina syndrome.  This is negative.  Does show minimal stenosis at foramen of L3.  Results discussed with patient.  Discussed strict blood pressure control and follow-up with PCP as well as gynecologist regarding her uterine findings  Patient tolerating p.o. and ambulatory. Discussed following up with her PCP as well as gynecologist.  Return precautions discussed.       Final Clinical Impression(s) / ED Diagnoses Final diagnoses:  Generalized abdominal pain  Elevated blood pressure reading  Dizziness  Uncontrolled hypertension    Rx / DC Orders ED Discharge Orders     None         Darvell Monteforte, Jeannett Senior, MD 07/17/23 [phone]

## 2023-07-17 NOTE — ED Notes (Signed)
Pt ambulated to restroom multiple times by herself. Steady gait.

## 2023-07-17 NOTE — Discharge Instructions (Addendum)
Your testing is reassuring.  No evidence of a heart attack or blood clot in the lung.  No evidence of a stroke.  Your symptoms are likely due to uncontrolled blood pressure.  It is important you restart your home blood pressure medications. Stop using marijuana.  Follow-up with your primary doctor as well as gynecologist for further evaluation of your uterine pathology seen on CT scan.  Take your blood pressure medication as prescribed and keep a record of your blood pressure for your doctor to make medication adjustments.  Thank you for the opportunity to take care of you in our Emergency Department. You have been diagnosed with high blood pressure, also known as hypertension. This means that the force of blood against the walls of your blood vessels called is too strong. It also means that your heart has to work harder to move the blood. High blood pressure usually has no symptoms, but over time, it can cause serious health problems such as Heart attack and heart failure Stroke Kidney disease and failure Vision loss With the help from your healthcare provider and some important life style changes, you can manage your blood pressure and protect your health. Please read the instructions provided on hypertension, how to manage it and how to check your blood pressure. Additionally, use the blood pressure log provided to record your blood pressures. Take the blood pressure log with you to your primary care doctor so that they can adjust your blood pressure medications if needed. Please read the instructions on follow-up appointment. Return to the ER or Call 911 right away if you have any of these symptoms: Chest pain or shortness of breath Severe headache Weakness, tingling, or numbness of your face, arms, or legs (especially on 1 side of the body) Sudden change in vision Confusion, trouble speaking, or trouble understanding speech

## 2023-07-19 ENCOUNTER — Telehealth: Payer: Self-pay | Admitting: Pharmacist

## 2023-07-19 ENCOUNTER — Other Ambulatory Visit: Payer: Self-pay | Admitting: Pharmacist

## 2023-07-19 NOTE — Progress Notes (Signed)
Attempted to contact patient related to ED visit. Left voicemail.   Catie Eppie Gibson, PharmD, BCACP, CPP Clinical Pharmacist [facility] [phone]

## 2023-07-19 NOTE — Progress Notes (Signed)
Received referral for assistance with managing uncontrolled hypertension from recent ED visit.   Outreached patient to discuss hypertension control and medication management. Reports she has not had a chance to pick up her medication refills yet, but plans to go this morning   Current antihypertensives: amlodipine/benazepril 5/10 mg daily, hydrochlorothiazide 25 mg daily - picked up hydrochlorothiazide today; combo amlodipine/benazepril is cheaper at [facility] pharmacy so she is going to pick that up there.   Patient does not have an automated upper arm home BP machine. Reports she cannot currently afford. Notes she had full Medicaid before her daughter aged out, but now just has family planning. Will call her case worker to see if she can apply for full Medicaid. Also discussed DSS team at [facility] Financial Assistance if she is still denied for full Medicaid.   Patient initially reported hypertensive symptoms including headache, but notes improvement after she picked up and restarted medications.    Assessment/Plan: - Currently uncontrolled but improved with medication access - - Reviewed appropriate administration of medication regimen - Counseled on long term microvascular and macrovascular complications of uncontrolled hypertension - Patient will investigate full Medicaid. If approved, can establish care with [facility]. She will call me back if she needs help navigating this.   Catie Eppie Gibson, PharmD, BCACP, CPP Clinical Pharmacist [facility] [phone]
